# Patient Record
Sex: Female | Born: 1945 | Hispanic: Yes | Marital: Married | Smoking: Never smoker
Health system: Southern US, Community
[De-identification: ages and names within clinical notes are randomized; demographics above are authoritative.]

## PROBLEM LIST (undated history)

## (undated) DIAGNOSIS — I10 Essential (primary) hypertension: Secondary | ICD-10-CM

## (undated) DIAGNOSIS — Z972 Presence of dental prosthetic device (complete) (partial): Secondary | ICD-10-CM

## (undated) DIAGNOSIS — R52 Pain, unspecified: Secondary | ICD-10-CM

## (undated) DIAGNOSIS — F419 Anxiety disorder, unspecified: Secondary | ICD-10-CM

---

## 2004-11-29 ENCOUNTER — Ambulatory Visit: Payer: Self-pay | Admitting: Family Medicine

## 2005-01-23 ENCOUNTER — Ambulatory Visit: Payer: Self-pay

## 2006-02-05 ENCOUNTER — Ambulatory Visit: Payer: Self-pay

## 2007-09-10 ENCOUNTER — Ambulatory Visit: Payer: Self-pay

## 2009-03-14 ENCOUNTER — Ambulatory Visit: Payer: Self-pay

## 2010-07-26 ENCOUNTER — Ambulatory Visit: Payer: Self-pay | Admitting: Family Medicine

## 2011-12-30 ENCOUNTER — Ambulatory Visit: Payer: Self-pay | Admitting: Family Medicine

## 2012-10-04 ENCOUNTER — Emergency Department: Payer: Self-pay | Admitting: Internal Medicine

## 2012-10-04 LAB — URINALYSIS, COMPLETE
Bacteria: NONE SEEN
Bilirubin,UR: NEGATIVE
Glucose,UR: NEGATIVE mg/dL (ref 0–75)
Leukocyte Esterase: NEGATIVE
Ph: 7 (ref 4.5–8.0)
Specific Gravity: 1.017 (ref 1.003–1.030)
Squamous Epithelial: 1
WBC UR: 3 /HPF (ref 0–5)

## 2012-10-04 LAB — CBC
HGB: 14.3 g/dL (ref 12.0–16.0)
MCH: 27.8 pg (ref 26.0–34.0)
MCV: 84 fL (ref 80–100)
RBC: 5.16 10*6/uL (ref 3.80–5.20)
RDW: 13.8 % (ref 11.5–14.5)
WBC: 7.4 10*3/uL (ref 3.6–11.0)

## 2012-10-04 LAB — COMPREHENSIVE METABOLIC PANEL
Albumin: 3.3 g/dL — ABNORMAL LOW (ref 3.4–5.0)
Anion Gap: 9 (ref 7–16)
Bilirubin,Total: 0.7 mg/dL (ref 0.2–1.0)
Calcium, Total: 8.7 mg/dL (ref 8.5–10.1)
Creatinine: 0.59 mg/dL — ABNORMAL LOW (ref 0.60–1.30)
Osmolality: 277 (ref 275–301)
Potassium: 2.7 mmol/L — ABNORMAL LOW (ref 3.5–5.1)
SGOT(AST): 29 U/L (ref 15–37)
Sodium: 139 mmol/L (ref 136–145)
Total Protein: 7.8 g/dL (ref 6.4–8.2)

## 2012-10-04 LAB — LIPASE, BLOOD: Lipase: 187 U/L (ref 73–393)

## 2013-04-14 ENCOUNTER — Ambulatory Visit: Payer: Self-pay | Admitting: Family Medicine

## 2014-02-03 ENCOUNTER — Ambulatory Visit: Payer: Self-pay | Admitting: Family Medicine

## 2014-07-13 ENCOUNTER — Ambulatory Visit: Payer: Self-pay | Admitting: Nurse Practitioner

## 2015-03-28 ENCOUNTER — Encounter: Payer: Self-pay | Admitting: Emergency Medicine

## 2015-03-28 ENCOUNTER — Observation Stay
Admission: EM | Admit: 2015-03-28 | Discharge: 2015-03-31 | Disposition: A | Discharge status: Home / Self Care | Payer: Medicare Other | Attending: Surgery | Admitting: Surgery

## 2015-03-28 ENCOUNTER — Emergency Department: Payer: Medicare Other

## 2015-03-28 DIAGNOSIS — K8020 Calculus of gallbladder without cholecystitis without obstruction: Secondary | ICD-10-CM | POA: Diagnosis present

## 2015-03-28 DIAGNOSIS — R51 Headache: Secondary | ICD-10-CM | POA: Diagnosis not present

## 2015-03-28 DIAGNOSIS — R1011 Right upper quadrant pain: Secondary | ICD-10-CM | POA: Diagnosis present

## 2015-03-28 DIAGNOSIS — I10 Essential (primary) hypertension: Secondary | ICD-10-CM | POA: Diagnosis not present

## 2015-03-28 DIAGNOSIS — K8000 Calculus of gallbladder with acute cholecystitis without obstruction: Principal | ICD-10-CM | POA: Insufficient documentation

## 2015-03-28 DIAGNOSIS — K8010 Calculus of gallbladder with chronic cholecystitis without obstruction: Secondary | ICD-10-CM

## 2015-03-28 DIAGNOSIS — K810 Acute cholecystitis: Secondary | ICD-10-CM | POA: Diagnosis not present

## 2015-03-28 HISTORY — DX: Essential (primary) hypertension: I10

## 2015-03-28 LAB — COMPREHENSIVE METABOLIC PANEL
ALBUMIN: 4.2 g/dL (ref 3.5–5.0)
ALK PHOS: 78 U/L (ref 38–126)
ALT: 21 U/L (ref 14–54)
AST: 22 U/L (ref 15–41)
Anion gap: 5 (ref 5–15)
BUN: 12 mg/dL (ref 6–20)
CO2: 28 mmol/L (ref 22–32)
Calcium: 8.7 mg/dL — ABNORMAL LOW (ref 8.9–10.3)
Chloride: 104 mmol/L (ref 101–111)
Creatinine, Ser: 0.53 mg/dL (ref 0.44–1.00)
GFR calc Af Amer: >60 mL/min (ref >60)
GFR calc non Af Amer: >60 mL/min (ref >60)
GLUCOSE: 97 mg/dL (ref 65–99)
Potassium: 3 mmol/L — ABNORMAL LOW (ref 3.5–5.1)
SODIUM: 137 mmol/L (ref 135–145)
TOTAL PROTEIN: 7.4 g/dL (ref 6.5–8.1)
Total Bilirubin: 1 mg/dL (ref 0.3–1.2)

## 2015-03-28 LAB — CBC
HEMATOCRIT: 43.5 % (ref 35.0–47.0)
HEMOGLOBIN: 14.1 g/dL (ref 12.0–16.0)
MCH: 27.8 pg (ref 26.0–34.0)
MCHC: 32.4 g/dL (ref 32.0–36.0)
MCV: 85.6 fL (ref 80.0–100.0)
PLATELETS: 205 10*3/uL (ref 150–440)
RBC: 5.08 MIL/uL (ref 3.80–5.20)
RDW: 14.2 % (ref 11.5–14.5)
WBC: 5.7 10*3/uL (ref 3.6–11.0)

## 2015-03-28 LAB — URINALYSIS COMPLETE WITH MICROSCOPIC (ARMC ONLY)
Bilirubin Urine: NEGATIVE
Glucose, UA: NEGATIVE mg/dL
HGB URINE DIPSTICK: NEGATIVE
KETONES UR: NEGATIVE mg/dL
NITRITE: NEGATIVE
Protein, ur: NEGATIVE mg/dL
SPECIFIC GRAVITY, URINE: 1.01 (ref 1.005–1.030)
pH: 6 (ref 5.0–8.0)

## 2015-03-28 LAB — LIPASE, BLOOD: LIPASE: 25 U/L (ref 22–51)

## 2015-03-28 LAB — CBC WITH DIFFERENTIAL/PLATELET
Basophils Absolute: 0.1 10*3/uL (ref 0–0.1)
Basophils Relative: 1 %
Eosinophils Absolute: 0.2 10*3/uL (ref 0–0.7)
Eosinophils Relative: 4 %
HCT: 44 % (ref 35.0–47.0)
Hemoglobin: 14.6 g/dL (ref 12.0–16.0)
LYMPHS PCT: 31 %
Lymphs Abs: 1.8 10*3/uL (ref 1.0–3.6)
MCH: 28 pg (ref 26.0–34.0)
MCHC: 33.2 g/dL (ref 32.0–36.0)
MCV: 84.6 fL (ref 80.0–100.0)
Monocytes Absolute: 0.3 10*3/uL (ref 0.2–0.9)
Monocytes Relative: 5 %
Neutro Abs: 3.4 10*3/uL (ref 1.4–6.5)
Neutrophils Relative %: 59 %
PLATELETS: 212 10*3/uL (ref 150–440)
RBC: 5.2 MIL/uL (ref 3.80–5.20)
RDW: 14.6 % — AB (ref 11.5–14.5)
WBC: 5.7 10*3/uL (ref 3.6–11.0)

## 2015-03-28 LAB — TROPONIN I: Troponin I: <0.03 ng/mL (ref <0.031)

## 2015-03-28 LAB — CREATININE, SERUM
CREATININE: 0.59 mg/dL (ref 0.44–1.00)
GFR calc Af Amer: >60 mL/min (ref >60)

## 2015-03-28 MED ORDER — MORPHINE SULFATE 2 MG/ML IJ SOLN
2.0000 mg | INTRAMUSCULAR | Status: DC | PRN
  Administered 2015-03-28 – 2015-03-30 (×4): 4 mg via INTRAVENOUS
  Filled 2015-03-28 (×4): qty 2

## 2015-03-28 MED ORDER — ONDANSETRON HCL 4 MG/2ML IJ SOLN
INTRAMUSCULAR | Status: AC
  Administered 2015-03-28: 4 mg via INTRAVENOUS
  Filled 2015-03-28: qty 2

## 2015-03-28 MED ORDER — HEPARIN SODIUM (PORCINE) 5000 UNIT/ML IJ SOLN
5000.0000 [IU] | Freq: Three times a day (TID) | INTRAMUSCULAR | Status: DC
Start: 2015-03-28 — End: 2015-03-28

## 2015-03-28 MED ORDER — ONDANSETRON HCL 4 MG/2ML IJ SOLN
4.0000 mg | Freq: Once | INTRAMUSCULAR | Status: AC
  Administered 2015-03-28: 4 mg via INTRAVENOUS

## 2015-03-28 MED ORDER — CEFAZOLIN SODIUM-DEXTROSE 2-3 GM-% IV SOLR
2.0000 g | Freq: Once | INTRAVENOUS | Status: AC
  Administered 2015-03-29: 2 g via INTRAVENOUS
  Filled 2015-03-28 (×2): qty 50

## 2015-03-28 MED ORDER — KETOROLAC TROMETHAMINE 30 MG/ML IJ SOLN
INTRAMUSCULAR | Status: AC
  Administered 2015-03-28: 30 mg via INTRAVENOUS
  Filled 2015-03-28: qty 1

## 2015-03-28 MED ORDER — ONDANSETRON HCL 4 MG/2ML IJ SOLN
4.0000 mg | Freq: Four times a day (QID) | INTRAMUSCULAR | Status: DC | PRN
  Administered 2015-03-29 – 2015-03-30 (×5): 4 mg via INTRAVENOUS
  Filled 2015-03-28 (×4): qty 2

## 2015-03-28 MED ORDER — PANTOPRAZOLE SODIUM 40 MG IV SOLR
40.0000 mg | Freq: Every day | INTRAVENOUS | Status: DC
  Administered 2015-03-28 – 2015-03-30 (×2): 40 mg via INTRAVENOUS
  Filled 2015-03-28 (×2): qty 40

## 2015-03-28 MED ORDER — HEPARIN SODIUM (PORCINE) 5000 UNIT/ML IJ SOLN
5000.0000 [IU] | Freq: Three times a day (TID) | INTRAMUSCULAR | Status: DC
  Administered 2015-03-28 – 2015-03-31 (×7): 5000 [IU] via SUBCUTANEOUS
  Filled 2015-03-28 (×8): qty 1

## 2015-03-28 MED ORDER — KETOROLAC TROMETHAMINE 30 MG/ML IJ SOLN
30.0000 mg | Freq: Once | INTRAMUSCULAR | Status: AC
  Administered 2015-03-28: 30 mg via INTRAVENOUS

## 2015-03-28 MED ORDER — POTASSIUM CHLORIDE IN NACL 20-0.9 MEQ/L-% IV SOLN
INTRAVENOUS | Status: DC
  Administered 2015-03-28 – 2015-03-30 (×4): via INTRAVENOUS
  Filled 2015-03-28 (×8): qty 1000

## 2015-03-28 MED ORDER — POTASSIUM CHLORIDE IN NACL 20-0.9 MEQ/L-% IV SOLN: INTRAVENOUS | Status: DC

## 2015-03-28 MED ORDER — SODIUM CHLORIDE 0.9 % IV BOLUS (SEPSIS)
1000.0000 mL | Freq: Once | INTRAVENOUS | Status: AC
  Administered 2015-03-28: 1000 mL via INTRAVENOUS

## 2015-03-28 NOTE — ED Notes (Signed)
Patient to ultrasound via stretcher by RN

## 2015-03-28 NOTE — ED Notes (Signed)
IV started. Medication given. Patient resting comfortably. Family at bedside. Patient voices no concerns at this time.

## 2015-03-28 NOTE — H&P (Signed)
CC: RUQ pain, postprandial x 3 days  HPI: [name] is a pleasant 69 yo F who presents with 3 days of RUQ pain.  Began acutely.  Worse after eating and with taking ibuprofen.  No nausea/vomiting.  No fevers/chills.  No sick contacts, no unusual ingestions. Has never had before.  No chest pain, shortness of breath, cough, dysuria/hematuria.    Active Ambulatory Problems    Diagnosis Date Noted  . No Active Ambulatory Problems   Resolved Ambulatory Problems    Diagnosis Date Noted  . No Resolved Ambulatory Problems   Past Medical History  Diagnosis Date  . Hypertension    No Known Allergies   . History   Social History  . Marital Status: Married    Spouse Name: N/A  . Number of Children: N/A  . Years of Education: N/A   Occupational History  . Not on file.   Social History Main Topics  . Smoking status: Never Smoker   . Smokeless tobacco: Not on file  . Alcohol Use: No  . Drug Use: No  . Sexual Activity: Not on file   Other Topics Concern  . Not on file   Social History Narrative  . No narrative on file   ROS: Full ros obtained, pertinent positives and negatives per HPI.    Blood pressure 174/83, pulse 61, temperature 98 F (36.7 C), resp. rate 16, height 5' (1.524 m), weight 80.287 kg (177 lb), SpO2 98 %. GEN: NAD/A&Ox3 FACE: no obvious facial trauma, normal external nose, normal external ears EYES: no scleral icterus, no conjunctivitis HEAD: normocephalic atraumatic CV: RRR, no MRG RESP: moving air well, lungs clear ABD: soft, mild tender RUQ, nondistended EXT: moving all ext well, strength 5/5 NEURO: cnII-XII grossly intact, sensation intact all 4 ext  Labs: personally reviewed, pertinent findings include WBC 5.7, Bili 1.1  U/S Gallbladder - multiple gallstones, cbd to 8.5 mm  A/P 69 yo F with 3 days RUQ pain, gallstones.  Pain characteristic of symptomatic cholelithiasis.  Have recommended cholecystectomy.  Have discussed benefits and potential  complications associated with procedure including 1:200 risk of CBD injury.  CBD mildly dilated.  Will f/u LFT in am to ensure no choledocholithiasis.

## 2015-03-28 NOTE — ED Notes (Signed)
Patient has had right upper quadrant pain for last 5 days. States it is worse after eating. Went to walk-in clinic today and was told to come here for testing.

## 2015-03-28 NOTE — ED Notes (Signed)
Pt to ed with c/o right upper abd pain since Friday.  Pt denies n/v/d.  States pain is worse after eating.

## 2015-03-28 NOTE — ED Provider Notes (Signed)
[facility] Emergency Department Provider Note  ____________________________________________  Time seen: 10:45 AM  I have reviewed the triage vital signs and the nursing notes.   HISTORY  Chief Complaint Abdominal Pain    HPI Anne Castillo is a 69 y.o. female who complains of right upper quadrant abdominal pain for 4 days. She denies fevers or chills, but notes that she has decreased appetite and the pain gets worse after she eats. No nausea vomiting or diarrhea. Never had pain like this before, no surgical history. No chest pain or shortness of breath or cough runny nose or recent illness.     Past Medical History  Diagnosis Date  . Hypertension     There are no active problems to display for this patient.   History reviewed. No pertinent past surgical history.  Current Outpatient Rx  Name  Route  Sig  Dispense  Refill  . hydrochlorothiazide (HYDRODIURIL) 25 MG tablet   Oral   Take 25 mg by mouth daily.           Allergies Review of patient's allergies indicates no known allergies.  History reviewed. No pertinent family history.  Social History History  Substance Use Topics  . Smoking status: Never Smoker   . Smokeless tobacco: Not on file  . Alcohol Use: No    Review of Systems  Constitutional: No fever or chills. No weight changes Eyes:No blurry vision or double vision.  ENT: No sore throat. Cardiovascular: No chest pain. Respiratory: No dyspnea or cough. Gastrointestinal: As above. No BRBPR or melena. Genitourinary: Negative for dysuria, urinary retention, bloody urine, or difficulty urinating. Musculoskeletal: Negative for back pain. No joint swelling or pain. Skin: Negative for rash. Neurological: Negative for headaches, focal weakness or numbness. Psychiatric:No anxiety or depression.   Endocrine:No hot/cold intolerance, changes in energy, or sleep difficulty.  10-point ROS otherwise  negative.  ____________________________________________   PHYSICAL EXAM:  VITAL SIGNS: ED Triage Vitals  Enc Vitals Group     BP 03/28/15 1014 207/92 mmHg     Pulse Rate 03/28/15 1014 78     Resp 03/28/15 1014 20     Temp 03/28/15 1014 98 F (36.7 C)     Temp src --      SpO2 03/28/15 1014 100 %     Weight 03/28/15 1014 177 lb (80.287 kg)     Height 03/28/15 1014 5' (1.524 m)     Head Cir --      Peak Flow --      Pain Score 03/28/15 1014 7     Pain Loc --      Pain Edu? --      Excl. in [place]? --      Constitutional: Alert and oriented. Mild distress due to pain Eyes: No scleral icterus. No conjunctival pallor. PERRL. EOMI ENT   Head: Normocephalic and atraumatic.   Nose: No congestion/rhinnorhea. No septal hematoma   Mouth/Throat: MMM, no pharyngeal erythema. No peritonsillar mass. No uvula shift.   Neck: No stridor. No SubQ emphysema. No meningismus. Hematological/Lymphatic/Immunilogical: No cervical lymphadenopathy. Cardiovascular: RRR. Normal and symmetric distal pulses are present in all extremities. No murmurs, rubs, or gallops. Respiratory: Normal respiratory effort without tachypnea nor retractions. Breath sounds are clear and equal bilaterally. No wheezes/rales/rhonchi. Gastrointestinal: Soft with tenderness in the right upper quadrant anteriorly and laterally, particularly with deep palpation. No distention. There is no CVA tenderness.  No rebound, rigidity, or guarding. Genitourinary: deferred Musculoskeletal: Nontender with normal range of motion  in all extremities. No joint effusions.  No lower extremity tenderness.  No edema. Neurologic:   Normal speech and language.  CN 2-10 normal. Motor grossly intact. No pronator drift.  Normal gait. No gross focal neurologic deficits are appreciated.  Skin:  Skin is warm, dry and intact. No rash noted.  No petechiae, purpura, or bullae. Psychiatric: Mood and affect are normal. Speech and behavior are  normal. Patient exhibits appropriate insight and judgment.  ____________________________________________    LABS (pertinent positives/negatives) (all labs ordered are listed, but only abnormal results are displayed) Labs Reviewed  CBC WITH DIFFERENTIAL/PLATELET - Abnormal; Notable for the following:    RDW 14.6 (*)    All other components within normal limits  COMPREHENSIVE METABOLIC PANEL - Abnormal; Notable for the following:    Potassium 3.0 (*)    Calcium 8.7 (*)    All other components within normal limits  URINALYSIS COMPLETEWITH MICROSCOPIC ([facility] ONLY) - Abnormal; Notable for the following:    Color, Urine STRAW (*)    APPearance CLEAR (*)    Leukocytes, UA TRACE (*)    Bacteria, UA RARE (*)    Squamous Epithelial / LPF 0-5 (*)    All other components within normal limits  LIPASE, BLOOD  TROPONIN I   ____________________________________________   EKG  Interpreted by me  Date: 03/28/2015  Rate: 69  Rhythm: normal sinus rhythm  QRS Axis: normal  Intervals: normal  ST/T Wave abnormalities: normal  Conduction Disutrbances: none  Narrative Interpretation: unremarkable      ____________________________________________    RADIOLOGY  Ultrasound right upper quadrant reveals multiple gallstones with gallbladder wall thickening. There is a dilated CBD without a clearly identified obstructing lesion.  ____________________________________________   PROCEDURES  ____________________________________________   INITIAL IMPRESSION / ASSESSMENT AND PLAN / ED COURSE  Pertinent labs & imaging results that were available during my care of the patient were reviewed by me and considered in my medical decision making (see chart for details).  Patient presents with symptoms concerning for cholecystitis. We will check labs and ultrasound. IV fluids Toradol Zofran.  ----------------------------------------- 3:25 PM on  03/28/2015 -----------------------------------------  Ultrasound concerning for early cholecystitis. Patient reports continued pain in the area, and has persistent tenderness in the right upper quadrant. Vital signs are unremarkable as are labs at this time. I discussed the case with surgery Dr. Rexene Edison who will evaluate the patient in the ED for further management.  ____________________________________________   FINAL CLINICAL IMPRESSION(S) / ED DIAGNOSES  Final diagnoses:  Cholelithiasis with cholecystitis      Carrie Mew, MD 03/28/15 1526

## 2015-03-28 NOTE — ED Notes (Signed)
Brought over from [facility] with RUQ pain for about 5 days ..rates pain 8/10

## 2015-03-29 ENCOUNTER — Encounter
Admission: EM | Disposition: A | Discharge status: Home / Self Care | Payer: Self-pay | Source: Home / Self Care | Attending: Emergency Medicine

## 2015-03-29 ENCOUNTER — Observation Stay: Payer: Medicare Other | Admitting: Certified Registered Nurse Anesthetist

## 2015-03-29 DIAGNOSIS — K810 Acute cholecystitis: Secondary | ICD-10-CM | POA: Diagnosis not present

## 2015-03-29 HISTORY — PX: CHOLECYSTECTOMY: SHX55

## 2015-03-29 LAB — COMPREHENSIVE METABOLIC PANEL
ALBUMIN: 3.3 g/dL — AB (ref 3.5–5.0)
ALT: 18 U/L (ref 14–54)
ANION GAP: 7 (ref 5–15)
AST: 19 U/L (ref 15–41)
Alkaline Phosphatase: 60 U/L (ref 38–126)
BUN: 19 mg/dL (ref 6–20)
CO2: 27 mmol/L (ref 22–32)
Calcium: 8.2 mg/dL — ABNORMAL LOW (ref 8.9–10.3)
Chloride: 108 mmol/L (ref 101–111)
Creatinine, Ser: 0.68 mg/dL (ref 0.44–1.00)
GFR calc Af Amer: >60 mL/min (ref >60)
GFR calc non Af Amer: >60 mL/min (ref >60)
GLUCOSE: 125 mg/dL — AB (ref 65–99)
POTASSIUM: 3.4 mmol/L — AB (ref 3.5–5.1)
Sodium: 142 mmol/L (ref 135–145)
TOTAL PROTEIN: 6.3 g/dL — AB (ref 6.5–8.1)
Total Bilirubin: 0.7 mg/dL (ref 0.3–1.2)

## 2015-03-29 LAB — CBC
HCT: 41.5 % (ref 35.0–47.0)
Hemoglobin: 13.8 g/dL (ref 12.0–16.0)
MCH: 28.5 pg (ref 26.0–34.0)
MCHC: 33.2 g/dL (ref 32.0–36.0)
MCV: 85.8 fL (ref 80.0–100.0)
PLATELETS: 194 10*3/uL (ref 150–440)
RBC: 4.84 MIL/uL (ref 3.80–5.20)
RDW: 14.2 % (ref 11.5–14.5)
WBC: 5.3 10*3/uL (ref 3.6–11.0)

## 2015-03-29 SURGERY — LAPAROSCOPIC CHOLECYSTECTOMY
Anesthesia: General

## 2015-03-29 MED ORDER — HYDROMORPHONE HCL 1 MG/ML IJ SOLN: 0.2500 mg | INTRAMUSCULAR | Status: DC | PRN

## 2015-03-29 MED ORDER — FENTANYL CITRATE (PF) 100 MCG/2ML IJ SOLN
INTRAMUSCULAR | Status: DC | PRN
  Administered 2015-03-29 (×3): 50 ug via INTRAVENOUS

## 2015-03-29 MED ORDER — LACTATED RINGERS IV SOLN
INTRAVENOUS | Status: DC | PRN
  Administered 2015-03-29: 20:00:00 via INTRAVENOUS

## 2015-03-29 MED ORDER — MIDAZOLAM HCL 2 MG/2ML IJ SOLN
INTRAMUSCULAR | Status: DC | PRN
  Administered 2015-03-29: 2 mg via INTRAVENOUS

## 2015-03-29 MED ORDER — PROPOFOL 10 MG/ML IV BOLUS
INTRAVENOUS | Status: DC | PRN
  Administered 2015-03-29: 150 mg via INTRAVENOUS

## 2015-03-29 MED ORDER — LIDOCAINE HCL 1 % IJ SOLN
INTRAMUSCULAR | Status: DC | PRN
  Administered 2015-03-29: 20 mL via SUBCUTANEOUS

## 2015-03-29 MED ORDER — HYDROCHLOROTHIAZIDE 25 MG PO TABS
25.0000 mg | ORAL_TABLET | Freq: Every day | ORAL | Status: DC
  Administered 2015-03-30 – 2015-03-31 (×2): 25 mg via ORAL
  Filled 2015-03-29 (×2): qty 1

## 2015-03-29 MED ORDER — HYDROCODONE-ACETAMINOPHEN 5-325 MG PO TABS
1.0000 | ORAL_TABLET | ORAL | Status: DC | PRN
  Administered 2015-03-30: 2 via ORAL
  Filled 2015-03-29: qty 2

## 2015-03-29 MED ORDER — ONDANSETRON HCL 4 MG/2ML IJ SOLN: 4.0000 mg | Freq: Once | INTRAMUSCULAR | Status: DC | PRN

## 2015-03-29 MED ORDER — FENTANYL CITRATE (PF) 100 MCG/2ML IJ SOLN
25.0000 ug | INTRAMUSCULAR | Status: AC | PRN
  Administered 2015-03-29 (×6): 25 ug via INTRAVENOUS

## 2015-03-29 MED ORDER — GLYCOPYRROLATE 0.2 MG/ML IJ SOLN
INTRAMUSCULAR | Status: DC | PRN
  Administered 2015-03-29: 0.6 mg via INTRAVENOUS

## 2015-03-29 MED ORDER — LABETALOL HCL 5 MG/ML IV SOLN
INTRAVENOUS | Status: DC | PRN
  Administered 2015-03-29: 5 mg via INTRAVENOUS

## 2015-03-29 MED ORDER — ROCURONIUM BROMIDE 100 MG/10ML IV SOLN
INTRAVENOUS | Status: DC | PRN
  Administered 2015-03-29: 30 mg via INTRAVENOUS

## 2015-03-29 MED ORDER — CEFAZOLIN SODIUM 1-5 GM-% IV SOLN
INTRAVENOUS | Status: DC | PRN
  Administered 2015-03-29: 1 g via INTRAVENOUS

## 2015-03-29 MED ORDER — NEOSTIGMINE METHYLSULFATE 10 MG/10ML IV SOLN
INTRAVENOUS | Status: DC | PRN
  Administered 2015-03-29: 3 mg via INTRAVENOUS

## 2015-03-29 MED ORDER — ACETAMINOPHEN 325 MG PO TABS
650.0000 mg | ORAL_TABLET | Freq: Four times a day (QID) | ORAL | Status: DC | PRN
  Administered 2015-03-29 – 2015-03-31 (×4): 650 mg via ORAL
  Filled 2015-03-29 (×4): qty 2

## 2015-03-29 SURGICAL SUPPLY — 50 items
APPLIER CLIP ROT 10 11.4 M/L (STAPLE) ×3
BAG COUNTER SPONGE EZ (MISCELLANEOUS) IMPLANT
BLADE SURG SZ11 CARB STEEL (BLADE) ×3 IMPLANT
BULB RESERV EVAC DRAIN JP 100C (MISCELLANEOUS) IMPLANT
CANISTER SUCT 1200ML W/VALVE (MISCELLANEOUS) ×3 IMPLANT
CATH REDDICK CHOLANGI 4FR 50CM (CATHETERS) IMPLANT
CHLORAPREP W/TINT 26ML (MISCELLANEOUS) ×3 IMPLANT
CLIP APPLIE ROT 10 11.4 M/L (STAPLE) ×1 IMPLANT
CLOSURE WOUND 1/2 X4 (GAUZE/BANDAGES/DRESSINGS) ×1
CONRAY 60ML FOR OR (MISCELLANEOUS) IMPLANT
COUNTER SPONGE BAG EZ (MISCELLANEOUS)
CUTTER LINEAR ENDO 35 ART THIN (STAPLE) ×3 IMPLANT
DISSECTOR KITTNER STICK (MISCELLANEOUS) ×1 IMPLANT
DISSECTORS/KITTNER STICK (MISCELLANEOUS) ×3
DRAIN CHANNEL JP 19F (MISCELLANEOUS) IMPLANT
DRAPE SHEET LG 3/4 BI-LAMINATE (DRAPES) ×3 IMPLANT
DRSG TEGADERM 2-3/8X2-3/4 SM (GAUZE/BANDAGES/DRESSINGS) ×12 IMPLANT
DRSG TELFA 3X8 NADH (GAUZE/BANDAGES/DRESSINGS) ×3 IMPLANT
ENDOLOOP SUT PDS II  0 18 (SUTURE)
ENDOLOOP SUT PDS II 0 18 (SUTURE) IMPLANT
ENDOPOUCH RETRIEVER 10 (MISCELLANEOUS) ×3 IMPLANT
GLOVE BIO SURGEON STRL SZ7.5 (GLOVE) ×3 IMPLANT
GOWN STRL REUS W/ TWL LRG LVL3 (GOWN DISPOSABLE) ×3 IMPLANT
GOWN STRL REUS W/TWL LRG LVL3 (GOWN DISPOSABLE) ×6
IRRIGATION STRYKERFLOW (MISCELLANEOUS) ×1 IMPLANT
IRRIGATOR STRYKERFLOW (MISCELLANEOUS) ×3
IV CATH ANGIO 12GX3 LT BLUE (NEEDLE) IMPLANT
IV NS 1000ML (IV SOLUTION) ×2
IV NS 1000ML BAXH (IV SOLUTION) ×1 IMPLANT
LABEL OR SOLS (LABEL) ×3 IMPLANT
LIQUID BAND (GAUZE/BANDAGES/DRESSINGS) IMPLANT
NDL SAFETY 25GX1.5 (NEEDLE) ×3 IMPLANT
NS IRRIG 500ML POUR BTL (IV SOLUTION) ×3 IMPLANT
PACK LAP CHOLECYSTECTOMY (MISCELLANEOUS) ×3 IMPLANT
PAD GROUND ADULT SPLIT (MISCELLANEOUS) ×3 IMPLANT
SCISSORS METZENBAUM CVD 33 (INSTRUMENTS) ×3 IMPLANT
SEAL FOR SCOPE WARMER C3101 (MISCELLANEOUS) IMPLANT
SLEEVE ENDOPATH XCEL 5M (ENDOMECHANICALS) ×3 IMPLANT
STRAP SAFETY BODY (MISCELLANEOUS) ×3 IMPLANT
STRIP CLOSURE SKIN 1/2X4 (GAUZE/BANDAGES/DRESSINGS) ×2 IMPLANT
SUT MNCRL 4-0 (SUTURE) ×2
SUT MNCRL 4-0 27XMFL (SUTURE) ×1
SUT VICRYL 0 AB UR-6 (SUTURE) ×6 IMPLANT
SUTURE MNCRL 4-0 27XMF (SUTURE) ×1 IMPLANT
SWABSTK COMLB BENZOIN TINCTURE (MISCELLANEOUS) IMPLANT
TROCAR XCEL BLUNT TIP 100MML (ENDOMECHANICALS) ×3 IMPLANT
TROCAR XCEL NON-BLD 11X100MML (ENDOMECHANICALS) ×3 IMPLANT
TROCAR XCEL NON-BLD 5MMX100MML (ENDOMECHANICALS) ×3 IMPLANT
TUBING INSUFFLATOR HI FLOW (MISCELLANEOUS) ×3 IMPLANT
WATER STERILE IRR 1000ML POUR (IV SOLUTION) ×3 IMPLANT

## 2015-03-29 NOTE — Anesthesia Preprocedure Evaluation (Signed)
Anesthesia Evaluation  Patient identified by MRN, date of birth, ID band Patient awake    Reviewed: Allergy & Precautions, NPO status , Patient's Chart, lab work & pertinent test results  History of Anesthesia Complications Negative for: history of anesthetic complications  Airway Mallampati: II  TM Distance: >3 FB Neck ROM: Full    Dental  (+) Partial Upper   Pulmonary neg pulmonary ROS,  breath sounds clear to auscultation  Pulmonary exam normal       Cardiovascular Exercise Tolerance: Good hypertension, Pt. on medications Normal cardiovascular examRhythm:Regular Rate:Normal     Neuro/Psych negative neurological ROS  negative psych ROS   GI/Hepatic negative GI ROS, Neg liver ROS,   Endo/Other  negative endocrine ROS  Renal/GU negative Renal ROS  negative genitourinary   Musculoskeletal negative musculoskeletal ROS (+)   Abdominal   Peds negative pediatric ROS (+)  Hematology negative hematology ROS (+)   Anesthesia Other Findings   Reproductive/Obstetrics negative OB ROS                             Anesthesia Physical Anesthesia Plan  ASA: II  Anesthesia Plan: General   Post-op Pain Management:    Induction: Intravenous  Airway Management Planned: Oral ETT  Additional Equipment:   Intra-op Plan:   Post-operative Plan: Extubation in OR  Informed Consent: I have reviewed the patients History and Physical, chart, labs and discussed the procedure including the risks, benefits and alternatives for the proposed anesthesia with the patient or authorized representative who has indicated his/her understanding and acceptance.   Dental advisory given  Plan Discussed with: CRNA and Surgeon  Anesthesia Plan Comments:         Anesthesia Quick Evaluation

## 2015-03-29 NOTE — Anesthesia Procedure Notes (Signed)
Procedure Name: Intubation Date/Time: 03/29/2015 8:00 PM Performed by: Lendon Colonel Pre-anesthesia Checklist: Emergency Drugs available, Patient identified, Suction available, Patient being monitored and Timeout performed Patient Re-evaluated:Patient Re-evaluated prior to inductionOxygen Delivery Method: Circle system utilized Preoxygenation: Pre-oxygenation with 100% oxygen Intubation Type: IV induction Ventilation: Mask ventilation without difficulty Laryngoscope Size: Miller and 2 Grade View: Grade II Tube type: Oral Number of attempts: 1 Airway Equipment and Method: Stylet Placement Confirmation: ETT inserted through vocal cords under direct vision,  positive ETCO2 and breath sounds checked- equal and bilateral Secured at: 20 cm Tube secured with: Tape Dental Injury: Teeth and Oropharynx as per pre-operative assessment  Future Recommendations: Recommend- awake intubation

## 2015-03-29 NOTE — Progress Notes (Signed)
Called Dr. Bary Castilla @ [phone] to get an order for tylenol per patient request.  Doctor consented.  Christene Slates  03/29/2015 4:55 AM

## 2015-03-29 NOTE — Op Note (Signed)
Preop dx: Symptomatic cholelithiasis Postop dx: Acute cholecystitis Procedure performed: Laparoscopic cholecystectomy Anesthesia: General EBL: 25 ml Complications: None Specimen: gallbladder  Indication for surgery: Ms Zanetti is a pleasant 69 yo F who presents with recurrent RUQ pain and gallstones. Her pain thought to be biliary in nature so I offered cholecystectomy.  Details of surgery: Informed consent was obtained.  Ms. Letterman was brought to the OR suite and laid supine on the OR table.  She was induced, ETT was placed, general anesthesia was administered.  Her abdomen was prepped and draped.  A timeout was performed correctly identifying patient name, operative site and procedure to be performed.  A supraumbilical incision was made and deepened to the fascia.  The fascia was incised, peritoneum was entered.  Two stay sutures were placed through the fasciotomy.  Hassan trocar was placed and abdomen was insufflated.  An 26mm epigastric and 2 5 mm subcostal trocars were placed.  Gallbladder was moderately inflamed.  Cystic duct and cystic artery were dissected out and critical view was obtained.  Cystic artery was clipped and ligated, cystic duct was mildly enlarged and transected with an endostapler.  Gallbladder was then taken off fossa and removed through umbilicus. Fossa was made hemostatic and irrigated until hemostasis obtained.  Trocars were then removed and abdomen desufflated.  Supraumbilical fascia was then closed with previously placed stay sutures.  Skin was then closed with interrupted deep dermal 4-0 monocryl.  Suture strips, telfa and tegaderm were used to dress incision.  Patient was then awoken, extubated and brought to PACU.  There were no immediate complications. Needle, sponge and instrument count was correct at the end of the procedure.

## 2015-03-29 NOTE — Progress Notes (Signed)
Surgery Progress Note  S: Min pain.   O: AF/VSS, good uop GEN: NAD/A&ox3 ABD: soft, min tender, nondistended  Labs Bili 0.7  A/P 69 yo f with symptomatic cholelithiasis, doing well - NPO - plan for lap chole today

## 2015-03-29 NOTE — Brief Op Note (Signed)
03/28/2015 - 03/29/2015  8:59 PM  PATIENT:  Tyler Pita  69 y.o. female  PRE-OPERATIVE DIAGNOSIS:  cholecystitis  POST-OPERATIVE DIAGNOSIS:  cholecystitis  PROCEDURE:  Procedure(s): LAPAROSCOPIC CHOLECYSTECTOMY (N/A)  SURGEON:  Surgeon(s) and Role:    * Marlyce Huge, MD - Primary  PHYSICIAN ASSISTANT:   ASSISTANTS: none   ANESTHESIA:   general  EBL:   57ml  BLOOD ADMINISTERED:none  DRAINS: none   LOCAL MEDICATIONS USED:  LIDOCAINE   SPECIMEN:  Excision  DISPOSITION OF SPECIMEN:  PATHOLOGY  COUNTS:  YES  TOURNIQUET:  * No tourniquets in log *  DICTATION: .Note written in EPIC  PLAN OF CARE: Admit for overnight observation  PATIENT DISPOSITION:  PACU - hemodynamically stable.   Delay start of Pharmacological VTE agent (>24hrs) due to surgical blood loss or risk of bleeding: no

## 2015-03-29 NOTE — Transfer of Care (Signed)
Immediate Anesthesia Transfer of Care Note  Patient: Anne Castillo  Procedure(s) Performed: Procedure(s): LAPAROSCOPIC CHOLECYSTECTOMY (N/A)  Patient Location: PACU  Anesthesia Type:General  Level of Consciousness: sedated  Airway & Oxygen Therapy: Patient Spontanous Breathing and Patient connected to face mask oxygen  Post-op Assessment: Report given to RN and Post -op Vital signs reviewed and stable  Post vital signs: Reviewed and stable  Last Vitals:  Filed Vitals:   03/29/15 1527  BP: 153/56  Pulse: 65  Temp: 36.5 C  Resp: 16    Complications: No apparent anesthesia complications

## 2015-03-30 ENCOUNTER — Encounter: Payer: Self-pay | Admitting: Surgery

## 2015-03-30 MED ORDER — PROMETHAZINE HCL 25 MG/ML IJ SOLN
12.5000 mg | Freq: Four times a day (QID) | INTRAMUSCULAR | Status: DC | PRN
  Administered 2015-03-30 (×2): 12.5 mg via INTRAVENOUS
  Filled 2015-03-30 (×2): qty 1

## 2015-03-30 NOTE — Progress Notes (Signed)
Surgery Progress note  S: Sore O: AF/VSS, good uop GEN: NAD/A&Ox3 ABD: soft, mild tender, nondistended  A/P 69 yo s/p lap chole, doing well - liquids advance - IV pain meds until tolerating PO

## 2015-03-30 NOTE — Progress Notes (Signed)
Spoke with Dr. Rexene Edison via phone regarding patient's nausea; orders received.

## 2015-03-30 NOTE — Anesthesia Postprocedure Evaluation (Addendum)
  Anesthesia Post-op Note  Patient: Anne Castillo  Procedure(s) Performed: Procedure(s): LAPAROSCOPIC CHOLECYSTECTOMY (N/A)  Anesthesia type:General  Patient location: PACU  Post pain: Pain level controlled  Post assessment: Post-op Vital signs reviewed, Patient's Cardiovascular Status Stable, Respiratory Function Stable, Patent Airway and No signs of Nausea or vomiting  Post vital signs: Reviewed and stable  Last Vitals:  Filed Vitals:   03/30/15 0527  BP: 151/71  Pulse: 93  Temp: 37.1 C  Resp: 18    Level of consciousness: awake, alert  and patient cooperative  Complications: No apparent anesthesia complications

## 2015-03-31 MED ORDER — BUTALBITAL-APAP-CAFFEINE 50-325-40 MG PO TABS
1.0000 | ORAL_TABLET | ORAL | Status: DC | PRN
  Administered 2015-03-31: 1 via ORAL
  Filled 2015-03-31: qty 1

## 2015-03-31 MED ORDER — HYDROCODONE-ACETAMINOPHEN 5-325 MG PO TABS: 1.0000 | ORAL_TABLET | ORAL | Status: DC | PRN

## 2015-03-31 NOTE — Discharge Instructions (Signed)
Do not drive on pain medications Do not lift greater than 15 lbs for a period of 6 weeks Call or return to ER if you develop fever greater than 101.5, nausea/vomiting, increased pain, redness/drainage from incisions Take bandages off in 48 hours.  Okay to shower with bandages on or after they come off, no tub baths

## 2015-03-31 NOTE — Progress Notes (Signed)
Pt d/c home; d/c instructions reviewed w/ pt; pt understanding was verbalized; IV removed catheter in tact, gauze dressing applied; all pt questions answered; pt left unit via wheelchair accompanied by staff 

## 2015-03-31 NOTE — Care Management (Signed)
Met with patient who had hoped to return home today but patient is still not able to tolerate diet. She denies RNCM needs.

## 2015-03-31 NOTE — Progress Notes (Signed)
Surgery Progress Note  S:  Headache, abdomen feeling better O: AF/VSS, good uop GEN: NAD/A&Ox3 ABD: soft, min tender, nondistended  A/P 69 yo F s/p lap chole, doing well - fiorcet - regular diet - PO pain meds - possible home later

## 2015-04-02 NOTE — Discharge Summary (Signed)
Discharge Diagnosis: Acute cholecystitis     Medication List    TAKE these medications        hydrochlorothiazide 25 MG tablet  Commonly known as:  HYDRODIURIL  Take 25 mg by mouth daily.     HYDROcodone-acetaminophen 5-325 MG per tablet  Commonly known as:  NORCO/VICODIN  Take 1-2 tablets by mouth every 4 (four) hours as needed for moderate pain.       Indication for admission: Anne Castillo is a pleasant 69 yo F who presents with RUQ pain, gallstones and pain which was characteristic of biliary pain.  She was admitted for surgical management of this pain.    Hospital Course: Anne Castillo was admitted and underwent unremarkable cholecystectomy.  Following surgery, her diet was advanced from clear liquid to regular.  Her pain medication was advanced as tolerated from IV to oral narcotics.  At time of discharge, Anne Castillo was discharged to home in satisfactory condition and tolerating a regular diet with pain control with PO pain medication

## 2015-04-03 LAB — SURGICAL PATHOLOGY

## 2015-04-06 ENCOUNTER — Encounter: Payer: Self-pay | Admitting: Surgery

## 2015-04-06 ENCOUNTER — Ambulatory Visit (INDEPENDENT_AMBULATORY_CARE_PROVIDER_SITE_OTHER): Payer: Medicare Other | Admitting: Surgery

## 2015-04-06 VITALS — BP 179/90 | HR 77 | Temp 98.3°F | Ht 61.0 in | Wt 173.0 lb

## 2015-04-06 DIAGNOSIS — Z09 Encounter for follow-up examination after completed treatment for conditions other than malignant neoplasm: Secondary | ICD-10-CM

## 2015-04-06 NOTE — Patient Instructions (Addendum)
Follow-up as needed.  Please call our office with questions and concerns.  Avoid heavy lifting- greater than 15 lbs if possible.

## 2015-04-06 NOTE — Progress Notes (Signed)
Surgery Clinic Note  S: Doing well.  No pain, tolerating diet.  Having BM O: AF/VSS, good uop GEN: NAD/A&Ox3 ABD: soft, min tender, incision c/d/i with some bruising  A/P 69 yo s/p lap chole, doing well - f/u prn - advised no heavy lifting x 5 more weeks

## 2015-11-27 ENCOUNTER — Emergency Department: Payer: Medicare Other

## 2015-11-27 ENCOUNTER — Emergency Department
Admission: EM | Admit: 2015-11-27 | Discharge: 2015-11-27 | Disposition: A | Discharge status: Home / Self Care | Payer: Medicare Other | Attending: Emergency Medicine | Admitting: Emergency Medicine

## 2015-11-27 ENCOUNTER — Encounter: Payer: Self-pay | Admitting: Emergency Medicine

## 2015-11-27 DIAGNOSIS — R079 Chest pain, unspecified: Secondary | ICD-10-CM | POA: Diagnosis not present

## 2015-11-27 DIAGNOSIS — I10 Essential (primary) hypertension: Secondary | ICD-10-CM | POA: Insufficient documentation

## 2015-11-27 DIAGNOSIS — Z79899 Other long term (current) drug therapy: Secondary | ICD-10-CM | POA: Diagnosis not present

## 2015-11-27 LAB — CBC
HEMATOCRIT: 44.1 % (ref 35.0–47.0)
Hemoglobin: 14.7 g/dL (ref 12.0–16.0)
MCH: 27.8 pg (ref 26.0–34.0)
MCHC: 33.2 g/dL (ref 32.0–36.0)
MCV: 83.7 fL (ref 80.0–100.0)
Platelets: 225 10*3/uL (ref 150–440)
RBC: 5.27 MIL/uL — ABNORMAL HIGH (ref 3.80–5.20)
RDW: 13.8 % (ref 11.5–14.5)
WBC: 6.5 10*3/uL (ref 3.6–11.0)

## 2015-11-27 LAB — BASIC METABOLIC PANEL
Anion gap: 10 (ref 5–15)
BUN: 16 mg/dL (ref 6–20)
CO2: 27 mmol/L (ref 22–32)
CREATININE: 0.62 mg/dL (ref 0.44–1.00)
Calcium: 9.6 mg/dL (ref 8.9–10.3)
Chloride: 104 mmol/L (ref 101–111)
GFR calc Af Amer: >60 mL/min (ref >60)
Glucose, Bld: 103 mg/dL — ABNORMAL HIGH (ref 65–99)
Potassium: 3 mmol/L — ABNORMAL LOW (ref 3.5–5.1)
SODIUM: 141 mmol/L (ref 135–145)

## 2015-11-27 LAB — TROPONIN I: Troponin I: <0.03 ng/mL (ref <0.031)

## 2015-11-27 NOTE — ED Notes (Signed)
Pt presents with mid sternal chest pain started yesterday while sitting, states it comes and goes and is sharp/stabbing in nature radiating into her back.

## 2015-11-27 NOTE — ED Provider Notes (Signed)
[facility] Emergency Department Provider Note  ____________________________________________  Time seen: Approximately 6 PM  I have reviewed the triage vital signs and the nursing notes.   HISTORY  Chief Complaint Chest Pain    HPI CHACE FLEER is a 70 y.o. female with a history of hypertension who is presenting today with chest pain starting 7 PM yesterday. She says that she has had 4 episodes to the center of her chest that radiates through to her back. She says that the pain feels like an electric shock and only lasts a meters second or 2. She denies any shortness of breath, sweating, nausea or vomiting. She denies any history of smoking or heart disease in her family. She denies any pain at this time. Denies any pain with exertion.Denies any recent lifting or injury to the chest.   Past Medical History  Diagnosis Date  . Hypertension     Patient Active Problem List   Diagnosis Date Noted  . Cholelithiasis 03/28/2015    Past Surgical History  Procedure Laterality Date  . Cholecystectomy N/A 03/29/2015    Procedure: LAPAROSCOPIC CHOLECYSTECTOMY;  Surgeon: Marlyce Huge, MD;  Location: [facility] ORS;  Service: General;  Laterality: N/A;    Current Outpatient Rx  Name  Route  Sig  Dispense  Refill  . hydrochlorothiazide (HYDRODIURIL) 25 MG tablet   Oral   Take 25 mg by mouth daily.           Allergies Review of patient's allergies indicates no known allergies.  Family History  Problem Relation Age of Onset  . Cancer Mother     Kidney    Social History Social History  Substance Use Topics  . Smoking status: Never Smoker   . Smokeless tobacco: Never Used  . Alcohol Use: No    Review of Systems Constitutional: No fever/chills Eyes: No visual changes. ENT: No sore throat. Cardiovascular: As above Respiratory: Denies shortness of breath. Gastrointestinal: No abdominal pain.  No nausea, no vomiting.  No diarrhea.  No  constipation. Genitourinary: Negative for dysuria. Musculoskeletal: Negative for back pain. Skin: Negative for rash. Neurological: Negative for headaches, focal weakness or numbness.  10-point ROS otherwise negative.  ____________________________________________   PHYSICAL EXAM:  VITAL SIGNS: ED Triage Vitals  Enc Vitals Group     BP 11/27/15 1425 154/71 mmHg     Pulse Rate 11/27/15 1425 84     Resp 11/27/15 1425 18     Temp 11/27/15 1425 97.9 F (36.6 C)     Temp Source 11/27/15 1425 Oral     SpO2 11/27/15 1425 95 %     Weight 11/27/15 1425 175 lb (79.379 kg)     Height 11/27/15 1425 5' (1.524 m)     Head Cir --      Peak Flow --      Pain Score 11/27/15 1439 5     Pain Loc --      Pain Edu? --      Excl. in [place]? --     Constitutional: Alert and oriented. Well appearing and in no acute distress. Eyes: Conjunctivae are normal. PERRL. EOMI. Head: Atraumatic. Nose: No congestion/rhinnorhea. Mouth/Throat: Mucous membranes are moist.  Neck: No stridor.   Cardiovascular: Normal rate, regular rhythm. Grossly normal heart sounds.  Good peripheral circulation. Has tenderness palpation of anterior chest over the sternum distally but says this pain feels different than the pain that she came in for. Respiratory: Normal respiratory effort.  No retractions. Lungs CTAB. Gastrointestinal:  Soft and nontender. No distention. No abdominal bruits. No CVA tenderness. Musculoskeletal: No lower extremity tenderness nor edema.  No joint effusions. Neurologic:  Normal speech and language. No gross focal neurologic deficits are appreciated. No gait instability. Skin:  Skin is warm, dry and intact. No rash noted. Psychiatric: Mood and affect are normal. Speech and behavior are normal.  ____________________________________________   LABS (all labs ordered are listed, but only abnormal results are displayed)  Labs Reviewed  BASIC METABOLIC PANEL - Abnormal; Notable for the following:     Potassium 3.0 (*)    Glucose, Bld 103 (*)    All other components within normal limits  CBC - Abnormal; Notable for the following:    RBC 5.27 (*)    All other components within normal limits  TROPONIN I   ____________________________________________  EKG  ED ECG REPORT I, Doran Stabler, the attending physician, personally viewed and interpreted this ECG.   Date: 11/27/2015  EKG Time: 1425  Rate: 81  Rhythm: normal EKG, normal sinus rhythm  Axis: Normal axis  Intervals:none  ST&T Change: No ST segment elevation or depression. No abnormal T-wave inversion.  ____________________________________________  RADIOLOGY  No acute cardiopulmonary disease. ____________________________________________   PROCEDURES   ____________________________________________   INITIAL IMPRESSION / ASSESSMENT AND PLAN / ED COURSE  Pertinent labs & imaging results that were available during my care of the patient were reviewed by me and considered in my medical decision making (see chart for details).  ----------------------------------------- 6:19 PM on 11/27/2015 -----------------------------------------  Patient with a heart score of 3. Very atypical story for MI or ACS. Also with normal vital signs and very unlikely for this to be a pulmonary embolus. Because the patient is 70 years old, female and with hypertension I recommended to the patient as well as her daughter who is at the bedside of the patient follow up with cardiology. I will give them the phone number for the on-call cardiologist and they know to call first thing in the morning to follow-up within 72 hours. The patient will be discharged home. Because the pain was also reproducible, I recommended muscle cream such as Aspercreme or icy hot. Patient and the daughter understand this plan and are willing to comply. ____________________________________________   FINAL CLINICAL IMPRESSION(S) / ED DIAGNOSES  Chest  pain.    Orbie Pyo, MD 11/27/15 [phone]

## 2015-12-14 ENCOUNTER — Ambulatory Visit (INDEPENDENT_AMBULATORY_CARE_PROVIDER_SITE_OTHER): Payer: Medicare Other | Admitting: Cardiology

## 2015-12-14 ENCOUNTER — Ambulatory Visit: Payer: Medicare Other | Admitting: Cardiology

## 2015-12-14 ENCOUNTER — Encounter: Payer: Self-pay | Admitting: Cardiology

## 2015-12-14 VITALS — BP 152/82 | HR 65 | Ht 60.0 in | Wt 176.5 lb

## 2015-12-14 DIAGNOSIS — R079 Chest pain, unspecified: Secondary | ICD-10-CM

## 2015-12-14 DIAGNOSIS — I10 Essential (primary) hypertension: Secondary | ICD-10-CM

## 2015-12-14 MED ORDER — AMLODIPINE BESYLATE 5 MG PO TABS: 5.0000 mg | ORAL_TABLET | Freq: Every day | ORAL | Status: DC

## 2015-12-14 NOTE — Progress Notes (Signed)
Cardiology Office Note   Date:  12/14/2015   ID:  Anne Castillo, DOB [birth year], MRN [id number]  Referring Doctor:  Marguerita Merles, MD   Cardiologist:   Wende Bushy, MD   Reason for consultation:  Chief Complaint  Patient presents with  . other    F/u [facility] due to chest pain c/o elevated BP. Meds reviewed verbally with pt.      History of Present Illness: Anne Castillo is a 70 y.o. female who presents for  Chest pain. Onset was 3 weeks ago. Randomly occurring after dinner. Described as an electricity-like/pins and needles. 8 out of 10 in severity. Nonradiating. Lasted seconds at a time , but happen multiple times throughout the night, waking her up from sleep. She presented to the ER for said complaint. ruled out.  Here for further evaluation.   Patient also reports fluctuating blood pressure. She remembers during the time she had chest pain, her blood pressure was elevated in the 180s. So far, blood pressure usually in the 150s at home.   otherwise, patient denies headache, fever, cough, colds, shortness of breath, and nausea, diaphoresis, PND, orthopnea, edema.   ROS:  Please see the history of present illness. Aside from mentioned under HPI, all other systems are reviewed and negative.     Past Medical History  Diagnosis Date  . Hypertension     Past Surgical History  Procedure Laterality Date  . Cholecystectomy N/A 03/29/2015    Procedure: LAPAROSCOPIC CHOLECYSTECTOMY;  Surgeon: Marlyce Huge, MD;  Location: [facility] ORS;  Service: General;  Laterality: N/A;     reports that she has never smoked. She has never used smokeless tobacco. She reports that she does not drink alcohol or use illicit drugs.   family history includes Cancer in her mother.   Current Outpatient Prescriptions  Medication Sig Dispense Refill  . hydrochlorothiazide (HYDRODIURIL) 25 MG tablet Take 25 mg by mouth daily.    . Multiple Vitamin (MULTIVITAMIN) capsule Take 1 capsule by mouth  daily.     No current facility-administered medications for this visit.    Allergies: Review of patient's allergies indicates no known allergies.    PHYSICAL EXAM: VS:  BP 152/82 mmHg  Pulse 65  Ht 5' (1.524 m)  Wt 176 lb 8 oz (80.06 kg)  BMI 34.47 kg/m2 , Body mass index is 34.47 kg/(m^2). Wt Readings from Last 3 Encounters:  12/14/15 176 lb 8 oz (80.06 kg)  11/27/15 175 lb (79.379 kg)  04/06/15 173 lb (78.472 kg)    GENERAL:  well developed, well nourished, obese, not in acute distress HEENT: normocephalic, pink conjunctivae, anicteric sclerae, no xanthelasma, normal dentition, oropharynx clear NECK:  no neck vein engorgement, JVP normal, no hepatojugular reflux, carotid upstroke brisk and symmetric, no bruit, no thyromegaly, no lymphadenopathy LUNGS:  good respiratory effort, clear to auscultation bilaterally CV:  PMI not displaced, no thrills, no lifts, S1 and S2 within normal limits, no palpable S3 or S4, no murmurs, no rubs, no gallops ABD:  Soft, nontender, nondistended, normoactive bowel sounds, no abdominal aortic bruit, no hepatomegaly, no splenomegaly MS: nontender back, no kyphosis, no scoliosis, no joint deformities EXT:  2+ DP/PT pulses, no edema, no varicosities, no cyanosis, no clubbing SKIN: warm, nondiaphoretic, normal turgor, no ulcers NEUROPSYCH: alert, oriented to person, place, and time, sensory/motor grossly intact, normal mood, appropriate affect  Recent Labs: 03/29/2015: ALT 18 11/27/2015: BUN 16; Creatinine, Ser 0.62; Hemoglobin 14.7; Platelets 225; Potassium 3.0*; Sodium 141   Lipid  Panel No results found for: CHOL, TRIG, HDL, CHOLHDL, VLDL, LDLCALC, LDLDIRECT   Other studies Reviewed:  EKG:  EKG is ordered today. 12/14/2015 The ekg ordered today was personally reviewed by me and it reveals  Sinus rhythm, 65 bpm , poor R-wave progression.  Additional studies/ records that were reviewed personally reviewed by me today include:  None  available  ASSESSMENT AND PLAN:  1.  Chest pain  Patient has risk factors including hypertension, postmenopausal state. Recommend further assessment with stress echocardiogram and transthoracic echocardiogram. So far patient has had no recurrence. We'll continue to monitor.   Hypertension  Blood pressure elevated. Recommend to stop either chlorothiazide , especially in setting of  Hypokalemia. Recommend to start amlodipine 5 mg by mouth daily. Will up titrate if necessary. Continue blood pressure monitoring at home.  Current medicines are reviewed at length with the patient today.  The patient does not have concerns regarding medicines.  Labs/ tests ordered today include: Orders Placed This Encounter  Procedures  . EKG 12-Lead    I had a lengthy and detailed discussion with the patient regarding diagnoses, prognosis, diagnostic options, treatment options, and side effects of medications.   I counseled the patient on importance of lifestyle modification including heart healthy diet, regular physical activity.  Disposition:   FU with undersigned  After tests   Signed, Wende Bushy, MD  12/14/2015 11:00 AM    [place]

## 2015-12-14 NOTE — Patient Instructions (Addendum)
Medication Instructions:  Your physician has recommended you make the following change in your medication:  1. Stop taking your HCTZ. 2. Start taking amlodipine 5 mg once daily.    Labwork: Your physician recommends that you return for lab work in: 1 week for BMP  Date & Time: ______________________________________________________   Testing/Procedures: Your physician has requested that you have an echocardiogram. Echocardiography is a painless test that uses sound waves to create images of your heart. It provides your doctor with information about the size and shape of your heart and how well your heart's chambers and valves are working. This procedure takes approximately one hour. There are no restrictions for this procedure.  Date & Time:__________________________________________________________  Your physician has requested that you have a stress echocardiogram. For further information please visit [email or web address]. Please follow instruction sheet as given.  How to prepare for your Stress Echo test:  1. No caffeine for 24 hours prior to test 2. No smoking 24 hours prior to test. 3. Your medication may be taken with water.  If your doctor stopped a medication because of this test, do not take that medication. 4. Ladies, please do not wear dresses.  Skirts or pants are appropriate. Please wear a short sleeve shirt. 5. No perfume, cologne or lotion. 6. Wear comfortable walking shoes. No heels!        Date & Time: ___________________________________________________________   Follow-Up: Your physician recommends that you schedule a follow-up appointment after testing to review results.  Date & Time:_____________________________________________________________   Any Other Special Instructions Will Be Listed Below (If Applicable).     If you need a refill on your cardiac medications before your next appointment, please call your pharmacy.    Echocardiogram An  echocardiogram, or echocardiography, uses sound waves (ultrasound) to produce an image of your heart. The echocardiogram is simple, painless, obtained within a short period of time, and offers valuable information to your health care provider. The images from an echocardiogram can provide information such as:  Evidence of coronary artery disease (CAD).  Heart size.  Heart muscle function.  Heart valve function.  Aneurysm detection.  Evidence of a past heart attack.  Fluid buildup around the heart.  Heart muscle thickening.  Assess heart valve function. LET [facility] CARE PROVIDER KNOW ABOUT:  Any allergies you have.  All medicines you are taking, including vitamins, herbs, eye drops, creams, and over-the-counter medicines.  Previous problems you or members of your family have had with the use of anesthetics.  Any blood disorders you have.  Previous surgeries you have had.  Medical conditions you have.  Possibility of pregnancy, if this applies. BEFORE THE PROCEDURE  No special preparation is needed. Eat and drink normally.  PROCEDURE  7. In order to produce an image of your heart, gel will be applied to your chest and a wand-like tool (transducer) will be moved over your chest. The gel will help transmit the sound waves from the transducer. The sound waves will harmlessly bounce off your heart to allow the heart images to be captured in real-time motion. These images will then be recorded. 8. You may need an IV to receive a medicine that improves the quality of the pictures. AFTER THE PROCEDURE You may return to your normal schedule including diet, activities, and medicines, unless your health care provider tells you otherwise.   This information is not intended to replace advice given to you by your health care provider. Make sure you discuss any questions you have with  your health care provider.   Document Released: 09/20/2000 Document Revised: 10/14/2014 Document  Reviewed: 05/31/2013 Elsevier Interactive Patient Education 2016 [employer].     Exercise Stress Echocardiogram An exercise stress echocardiogram is a heart (cardiac) test used to check the function of your heart. This test may also be called an exercise stress echocardiography or stress echo. This stress test will check how well your heart muscle and valves are working and determine if your heart muscle is getting enough blood. You will exercise on a treadmill to naturally increase or stress the functioning of your heart.  An echocardiogram uses sound waves (ultrasound) to produce an image of your heart. If your heart does not work normally, it may indicate coronary artery disease with poor coronary blood supply. The coronary arteries are the arteries that bring blood and oxygen to your heart. LET [facility] CARE PROVIDER KNOW ABOUT:  Any allergies you have.  All medicines you are taking, including vitamins, herbs, eye drops, creams, and over-the-counter medicines.  Previous problems you or members of your family have had with the use of anesthetics.  Any blood disorders you have.  Previous surgeries you have had.  Medical conditions you have.  Possibility of pregnancy, if this applies. RISKS AND COMPLICATIONS Generally, this is a safe procedure. However, as with any procedure, complications can occur. Possible complications can include:  You develop pain or pressure in the following areas:  Chest.  Jaw or neck.  Between your shoulder blades.  Radiating down your left arm.  Dizziness or lightheadedness.  Shortness of breath.  Increased or irregular heartbeat.  Nausea or vomiting.  Heart attack (rare). BEFORE THE PROCEDURE 9. Avoid all forms of caffeine for 24 hours before your test or as directed by your health care provider. This includes coffee, tea (even decaffeinated tea), caffeinated sodas, chocolate, cocoa, and certain pain medicines. 10. Follow your health  care provider's instructions regarding eating and drinking before the test. 11. Take your medicines as directed at regular times with water unless instructed otherwise. Exceptions may include: 1. If you have diabetes, ask how you are to take your insulin or pills. It is common to adjust insulin dosing the morning of the test. 2. If you are taking beta-blocker medicines, it is important to talk to your health care provider about these medicines well before the date of your test. Taking beta-blocker medicines may interfere with the test. In some cases, these medicines need to be changed or stopped 24 hours or more before the test. 3. If you wear a nitroglycerin patch, it may need to be removed prior to the test. Ask your health care provider if the patch should be removed before the test. 12. If you use an inhaler for any breathing condition, bring it with you to the test. 13. If you are an outpatient, bring a snack so you can eat right after the stress phase of the test. 14. Do not smoke for 4 hours prior to the test or as directed by your health care provider. 15. Wear loose-fitting clothes and comfortable shoes for the test. This test involves walking on a treadmill. PROCEDURE   Multiple electrodes will be put on your chest. If needed, small areas of your chest may be shaved to get better contact with the electrodes. Once the electrodes are attached to your body, multiple wires will be attached to the electrodes, and your heart rate will be monitored.  You will have an echocardiogram done at rest.  To produce  this image of your heart, gel is applied to your chest, and a wand-like tool (transducer) is moved over the chest. The transducer sends the sound waves through the chest to create the moving images of your heart.  You may need an IV to receive a medication that improves the quality of the pictures.  You will then walk on a treadmill. The treadmill will be started at a slow pace. The  treadmill speed and incline will gradually be increased to raise your heart rate.  At the peak of exercise, the treadmill will be stopped. You will lie down immediately on a bed so that a second echocardiogram can be done to visualize your heart's motion with exercise.  The test usually takes 30-60 minutes to complete. AFTER THE PROCEDURE  Your heart rate and blood pressure will be monitored after the test.  You may return to your normal schedule, including diet, activities, and medicines, unless your health care provider tells you otherwise.   This information is not intended to replace advice given to you by your health care provider. Make sure you discuss any questions you have with your health care provider.   Document Released: 09/27/2004 Document Revised: 09/28/2013 Document Reviewed: 05/31/2013 Elsevier Interactive Patient Education [employer].

## 2015-12-21 ENCOUNTER — Other Ambulatory Visit (INDEPENDENT_AMBULATORY_CARE_PROVIDER_SITE_OTHER): Payer: Medicare Other

## 2015-12-21 DIAGNOSIS — R079 Chest pain, unspecified: Secondary | ICD-10-CM | POA: Diagnosis not present

## 2015-12-22 LAB — BASIC METABOLIC PANEL
BUN/Creatinine Ratio: 26 (ref 11–26)
BUN: 15 mg/dL (ref 8–27)
CALCIUM: 9.3 mg/dL (ref 8.7–10.3)
CO2: 25 mmol/L (ref 18–29)
CREATININE: 0.57 mg/dL (ref 0.57–1.00)
Chloride: 101 mmol/L (ref 96–106)
GFR calc Af Amer: 110 mL/min/{1.73_m2} (ref >59)
GFR, EST NON AFRICAN AMERICAN: 96 mL/min/{1.73_m2} (ref >59)
Glucose: 106 mg/dL — ABNORMAL HIGH (ref 65–99)
Potassium: 4.6 mmol/L (ref 3.5–5.2)
Sodium: 140 mmol/L (ref 134–144)

## 2015-12-28 ENCOUNTER — Ambulatory Visit (INDEPENDENT_AMBULATORY_CARE_PROVIDER_SITE_OTHER): Payer: Medicare Other

## 2015-12-28 ENCOUNTER — Other Ambulatory Visit: Payer: Self-pay

## 2015-12-28 DIAGNOSIS — R079 Chest pain, unspecified: Secondary | ICD-10-CM | POA: Diagnosis not present

## 2016-01-02 ENCOUNTER — Ambulatory Visit (INDEPENDENT_AMBULATORY_CARE_PROVIDER_SITE_OTHER): Payer: Medicare Other

## 2016-01-02 DIAGNOSIS — R079 Chest pain, unspecified: Secondary | ICD-10-CM

## 2016-01-02 LAB — ECHOCARDIOGRAM STRESS TEST
CHL CUP MPHR: 152 {beats}/min
CSEPEW: 6.6 METS
Exercise duration (min): 4 min
Exercise duration (sec): 42 s
Peak HR: 144 {beats}/min
Percent HR: 94 %
Rest HR: 80 {beats}/min

## 2016-01-04 ENCOUNTER — Encounter (INDEPENDENT_AMBULATORY_CARE_PROVIDER_SITE_OTHER): Payer: Self-pay

## 2016-01-04 ENCOUNTER — Ambulatory Visit (INDEPENDENT_AMBULATORY_CARE_PROVIDER_SITE_OTHER): Payer: Medicare Other | Admitting: Cardiology

## 2016-01-04 ENCOUNTER — Encounter: Payer: Self-pay | Admitting: Cardiology

## 2016-01-04 VITALS — BP 130/80 | HR 69 | Ht 60.0 in | Wt 178.0 lb

## 2016-01-04 DIAGNOSIS — R079 Chest pain, unspecified: Secondary | ICD-10-CM | POA: Diagnosis not present

## 2016-01-04 DIAGNOSIS — I10 Essential (primary) hypertension: Secondary | ICD-10-CM

## 2016-01-04 MED ORDER — AMLODIPINE BESYLATE 5 MG PO TABS: 5.0000 mg | ORAL_TABLET | Freq: Every day | ORAL | Status: DC

## 2016-01-04 NOTE — Progress Notes (Signed)
Cardiology Office Note   Date:  01/04/2016   ID:  Anne, Castillo [birth year], MRN [id number]  Referring Doctor:  Marguerita Merles, MD   Cardiologist:   Wende Bushy, MD   Reason for consultation:  Chief Complaint  Patient presents with  . other    F/u stress echo no complaints today. Meds reviewed verbally with pt.      History of Present Illness: Anne Castillo is a 70 y.o. female who presents for  Follow-up for chest pain, after stress test and echocardiogram.  Patient has had no recurrence of symptoms. Patient reports that her blood pressure is much better on the amlodipine. She has stopped taking the HCTZ. She reports that the top number is usually in the 130s.  patient denies headache, fever, cough, colds, shortness of breath, and nausea, diaphoresis, PND, orthopnea, edema.   ROS:  Please see the history of present illness. Aside from mentioned under HPI, all other systems are reviewed and negative.     Past Medical History  Diagnosis Date  . Hypertension     Past Surgical History  Procedure Laterality Date  . Cholecystectomy N/A 03/29/2015    Procedure: LAPAROSCOPIC CHOLECYSTECTOMY;  Surgeon: Marlyce Huge, MD;  Location: [facility] ORS;  Service: General;  Laterality: N/A;     reports that she has never smoked. She has never used smokeless tobacco. She reports that she does not drink alcohol or use illicit drugs.   family history includes Cancer in her mother.   Current Outpatient Prescriptions  Medication Sig Dispense Refill  . amLODipine (NORVASC) 5 MG tablet Take 1 tablet (5 mg total) by mouth daily. 30 tablet 11  . Multiple Vitamin (MULTIVITAMIN) capsule Take 1 capsule by mouth daily.     No current facility-administered medications for this visit.    Allergies: Review of patient's allergies indicates no known allergies.    PHYSICAL EXAM: VS:  BP 130/80 mmHg  Pulse 69  Ht 5' (1.524 m)  Wt 178 lb (80.74 kg)  BMI 34.76 kg/m2 , Body mass  index is 34.76 kg/(m^2). Wt Readings from Last 3 Encounters:  01/04/16 178 lb (80.74 kg)  12/14/15 176 lb 8 oz (80.06 kg)  11/27/15 175 lb (79.379 kg)    GENERAL:  well developed, well nourished, obese, not in acute distress HEENT: normocephalic, pink conjunctivae, anicteric sclerae, no xanthelasma, normal dentition, oropharynx clear NECK:  no neck vein engorgement, JVP normal, no hepatojugular reflux, carotid upstroke brisk and symmetric, no bruit, no thyromegaly, no lymphadenopathy LUNGS:  good respiratory effort, clear to auscultation bilaterally CV:  PMI not displaced, no thrills, no lifts, S1 and S2 within normal limits, no palpable S3 or S4, no murmurs, no rubs, no gallops ABD:  Soft, nontender, nondistended, normoactive bowel sounds, no abdominal aortic bruit, no hepatomegaly, no splenomegaly MS: nontender back, no kyphosis, no scoliosis, no joint deformities EXT:  2+ DP/PT pulses, no edema, no varicosities, no cyanosis, no clubbing SKIN: warm, nondiaphoretic, normal turgor, no ulcers NEUROPSYCH: alert, oriented to person, place, and time, sensory/motor grossly intact, normal mood, appropriate affect  Recent Labs: 03/29/2015: ALT 18 11/27/2015: Hemoglobin 14.7; Platelets 225 12/21/2015: BUN 15; Creatinine, Ser 0.57; Potassium 4.6; Sodium 140 K improved  Lipid Panel No results found for: CHOL, TRIG, HDL, CHOLHDL, VLDL, LDLCALC, LDLDIRECT   Other studies Reviewed:  EKG: The ekg from 12/14/2015 was personally reviewed by me and it reveals  Sinus rhythm, 65 bpm , poor R-wave progression.  Additional studies/ records that were reviewed  personally reviewed by me today include:   Echocardiogram 12/28/2015: - Left ventricle: The cavity size was normal. Systolic function was  normal. The estimated ejection fraction was in the range of 60%  to 65%. Wall motion was normal; there were no regional wall  motion abnormalities. Left ventricular diastolic function  parameters were normal. -  Left atrium: The atrium was normal in size. - Right ventricle: Systolic function was normal. - Pulmonary arteries: PA peak pressure: 37 mm Hg (S).  Stress echo 01/02/2016: Study Conclusions - Stress ECG conclusions: There were no stress arrhythmias or  conduction abnormalities. The stress ECG was negative for  ischemia. - Staged echo: Normal echo stress - Peak stress: The estimated LV ejection fraction was 70-75%. Impressions: - Normal study after maximal exercise. Target heart rate achieved.  Hypertensive at rest and with stress.    ASSESSMENT AND PLAN:  Chest pain  Patient has risk factors including hypertension, postmenopausal state.  No recurrence of symptoms.  Echocardiogram from 12/28/2015 showed normal systolic function.  Stress echocardiogram from 12/25/2015 was negative. Patient was noted to be hypertensive at rest and with stress. Discussed the results of these tests with the patient and her family, in detail. Patient was reassured. Inform them that a normal stress echocardiogram portends a low risk for clinically significant CAD. Patient to continue to monitor symptoms and follow-up with [place] when necessary.   Hypertension  Blood pressure better on the amlodipine. On last visit,  recommend to stop HCTZ, especially in setting of  Hypokalemia. Continue amlodipine 5 mg by mouth daily. Continue blood pressure monitoring at home. Lifestyle changes recommended.  Current medicines are reviewed at length with the patient today.  The patient does not have concerns regarding medicines.  Labs/ tests ordered today include: No orders of the defined types were placed in this encounter.    I had a lengthy and detailed discussion with the patient regarding diagnoses, prognosis, diagnostic options, treatment options, and side effects of medications.   I counseled the patient on importance of lifestyle modification including heart healthy diet, regular physical  activity.  Disposition:   FU with undersigned when necessary Patient to follow-up with PCP for hypertension  Signed, Wende Bushy, MD  01/04/2016 9:38 AM    [place]

## 2016-01-04 NOTE — Patient Instructions (Signed)
Medication Instructions:  Your physician recommends that you continue on your current medications as directed. Please refer to the Current Medication list given to you today.   Labwork: None Ordered  Testing/Procedures: None Ordered  Follow-Up: Your physician recommends that you schedule a follow-up appointment as needed.    Any Other Special Instructions Will Be Listed Below (If Applicable).     If you need a refill on your cardiac medications before your next appointment, please call your pharmacy.

## 2016-02-22 ENCOUNTER — Ambulatory Visit (INDEPENDENT_AMBULATORY_CARE_PROVIDER_SITE_OTHER): Payer: Medicare Other | Admitting: General Surgery

## 2016-02-22 ENCOUNTER — Encounter: Payer: Self-pay | Admitting: General Surgery

## 2016-02-22 VITALS — BP 140/78 | HR 68 | Resp 14 | Ht 60.0 in | Wt 177.0 lb

## 2016-02-22 DIAGNOSIS — K439 Ventral hernia without obstruction or gangrene: Secondary | ICD-10-CM

## 2016-02-22 NOTE — Patient Instructions (Addendum)
Hernia, Adult A hernia is the bulging of an organ or tissue through a weak spot in the muscles of the abdomen (abdominal wall). Hernias develop most often near the navel or groin. There are many kinds of hernias. Common kinds include:  Femoral hernia. This kind of hernia develops under the groin in the upper thigh area.  Inguinal hernia. This kind of hernia develops in the groin or scrotum.  Umbilical hernia. This kind of hernia develops near the navel.  Hiatal hernia. This kind of hernia causes part of the stomach to be pushed up into the chest.  Incisional hernia. This kind of hernia bulges through a scar from an abdominal surgery. CAUSES This condition may be caused by:  Heavy lifting.  Coughing over a long period of time.  Straining to have a bowel movement.  An incision made during an abdominal surgery.  A birth defect (congenital defect).  Excess weight or obesity.  Smoking.  Poor nutrition.  Cystic fibrosis.  Excess fluid in the abdomen.  Undescended testicles. SYMPTOMS Symptoms of a hernia include:  A lump on the abdomen. This is the first sign of a hernia. The lump may become more obvious with standing, straining, or coughing. It may get bigger over time if it is not treated or if the condition causing it is not treated.  Pain. A hernia is usually painless, but it may become painful over time if treatment is delayed. The pain is usually dull and may get worse with standing or lifting heavy objects. Sometimes a hernia gets tightly squeezed in the weak spot (strangulated) or stuck there (incarcerated) and causes additional symptoms. These symptoms may include:  Vomiting.  Nausea.  Constipation.  Irritability. DIAGNOSIS A hernia may be diagnosed with:  A physical exam. During the exam your health care provider may ask you to cough or to make a specific movement, because a hernia is usually more visible when you move.  Imaging tests. These can  include:  X-rays.  Ultrasound.  CT scan. TREATMENT A hernia that is small and painless may not need to be treated. A hernia that is large or painful may be treated with surgery. Inguinal hernias may be treated with surgery to prevent incarceration or strangulation. Strangulated hernias are always treated with surgery, because lack of blood to the trapped organ or tissue can cause it to die. Surgery to treat a hernia involves pushing the bulge back into place and repairing the weak part of the abdomen. HOME CARE INSTRUCTIONS  Avoid straining.  Do not lift anything heavier than 10 lb (4.5 kg).  Lift with your leg muscles, not your back muscles. This helps avoid strain.  When coughing, try to cough gently.  Prevent constipation. Constipation leads to straining with bowel movements, which can make a hernia worse or cause a hernia repair to break down. You can prevent constipation by:  Eating a high-fiber diet that includes plenty of fruits and vegetables.  Drinking enough fluids to keep your urine clear or pale yellow. Aim to drink 6-8 glasses of water per day.  Using a stool softener as directed by your health care provider.  Lose weight, if you are overweight.  Do not use any tobacco products, including cigarettes, chewing tobacco, or electronic cigarettes. If you need help quitting, ask your health care provider.  Keep all follow-up visits as directed by your health care provider. This is important. Your health care provider may need to monitor your condition. SEEK MEDICAL CARE IF:  You have   swelling, redness, and pain in the affected area.  Your bowel habits change. SEEK IMMEDIATE MEDICAL CARE IF:  You have a fever.  You have abdominal pain that is getting worse.  You feel nauseous or you vomit.  You cannot push the hernia back in place by gently pressing on it while you are lying down.  The hernia:  Changes in shape or size.  Is stuck outside the  abdomen.  Becomes discolored.  Feels hard or tender.   This information is not intended to replace advice given to you by your health care provider. Make sure you discuss any questions you have with your health care provider.   Document Released: 09/23/2005 Document Revised: 10/14/2014 Document Reviewed: 08/03/2014 Elsevier Interactive Patient Education 2016 [employer].  Patient's surgery has been scheduled for 03-19-16 at [facility].

## 2016-02-22 NOTE — Progress Notes (Signed)
Patient ID: Anne Castillo, female   DOB: [birth year], 70 y.o.   MRN: [id number]  Chief Complaint  Patient presents with  . Other    ventral hernia    HPI Anne Castillo is a 70 y.o. female here today for a evaluation of ventral hernia. Patient noticed this area soon after she had her cholecystectomy on 03/29/15. She reports the area bulges out when she sneezes. She reports that she will have pain in the area occasionally, mostly with bending to the side and straightening up from bending over. She denies any GI problems. She states that the area has gotten larger.   She is here today with [facility] the interpreter.  HPI  Past Medical History  Diagnosis Date  . Hypertension     Past Surgical History  Procedure Laterality Date  . Cholecystectomy N/A 03/29/2015    Procedure: LAPAROSCOPIC CHOLECYSTECTOMY;  Surgeon: Marlyce Huge, MD;  Location: [facility] ORS;  Service: General;  Laterality: N/A;    Family History  Problem Relation Age of Onset  . Kidney cancer Mother     Social History Social History  Substance Use Topics  . Smoking status: Never Smoker   . Smokeless tobacco: Never Used  . Alcohol Use: No    No Known Allergies  Current Outpatient Prescriptions  Medication Sig Dispense Refill  . amLODipine (NORVASC) 5 MG tablet Take 1 tablet (5 mg total) by mouth daily. 30 tablet 11  . aspirin EC 81 MG tablet Take 81 mg by mouth.    . Calcium Carb-Ergocalciferol 500-200 MG-UNIT TABS Take 1 tablet by mouth daily.    . hydrochlorothiazide (HYDRODIURIL) 25 MG tablet TAKE 1 TABLET (25 MG TOTAL) BY MOUTH DAILY.  3  . Multiple Vitamin (MULTIVITAMIN) capsule Take 1 capsule by mouth daily.    Marland Kitchen VITAMIN A OP Apply to eye.     No current facility-administered medications for this visit.    Review of Systems Review of Systems  Constitutional: Negative.   Respiratory: Negative.   Cardiovascular: Negative.     Blood pressure 140/78, pulse 68, resp. rate 14, height 5' (1.524 m),  weight 177 lb (80.287 kg).  Physical Exam Physical Exam  Constitutional: She is oriented to person, place, and time. She appears well-developed and well-nourished.  Eyes: Conjunctivae are normal. No scleral icterus.  Neck: Neck supple.  Cardiovascular: Normal rate, regular rhythm and normal heart sounds.   Pulmonary/Chest: Effort normal and breath sounds normal.  Abdominal: Soft. Normal appearance and bowel sounds are normal. There is no hepatomegaly. There is no tenderness. A hernia is present. Hernia confirmed positive in the ventral area.    Lymphadenopathy:    She has no cervical adenopathy.  Neurological: She is alert and oriented to person, place, and time.  Skin: Skin is warm and dry.    Data Reviewed Operative note reviewed. Fascial defect was closed, likely with 0 Vicryl based on material list review.  Assessment    Ventral hernia.    Plan    Clinical exam suggests a small fascial defect which will likely be managed by primary repair. Possibility of mesh placement was discussed.    Hernia precautions and incarceration were discussed with the patient. If they develop symptoms of an incarcerated hernia, they were encouraged to seek prompt medical attention.  I have recommended repair of the hernia possibly using mesh on an outpatient basis in the near future. The risk of infection was reviewed. The role of prosthetic mesh to minimize the risk of recurrence  was reviewed.  Patient's surgery has been scheduled for 03-19-16 at [facility]. It is okay for patient to continue 81 mg aspirin once daily.   PCP:  Marygrace Drought This has been scribed by Lesly Rubenstein LPN     Robert Bellow 02/23/2016, 3:34 PM

## 2016-02-23 DIAGNOSIS — K439 Ventral hernia without obstruction or gangrene: Secondary | ICD-10-CM | POA: Insufficient documentation

## 2016-02-23 NOTE — H&P (Signed)
HPI  Anne Castillo is a 70 y.o. female here today for a evaluation of ventral hernia. Patient noticed this area soon after she had her cholecystectomy on 03/29/15. She reports the area bulges out when she sneezes. She reports that she will have pain in the area occasionally, mostly with bending to the side and straightening up from bending over. She denies any GI problems. She states that the area has gotten larger.  She is here today with [facility] the interpreter.  HPI  Past Medical History   Diagnosis  Date   .  Hypertension     Past Surgical History   Procedure  Laterality  Date   .  Cholecystectomy  N/A  03/29/2015     Procedure: LAPAROSCOPIC CHOLECYSTECTOMY; Surgeon: Marlyce Huge, MD; Location: [facility] ORS; Service: General; Laterality: N/A;    Family History   Problem  Relation  Age of Onset   .  Kidney cancer  Mother     Social History  Social History   Substance Use Topics   .  Smoking status:  Never Smoker   .  Smokeless tobacco:  Never Used   .  Alcohol Use:  No    No Known Allergies  Current Outpatient Prescriptions   Medication  Sig  Dispense  Refill   .  amLODipine (NORVASC) 5 MG tablet  Take 1 tablet (5 mg total) by mouth daily.  30 tablet  11   .  aspirin EC 81 MG tablet  Take 81 mg by mouth.     .  Calcium Carb-Ergocalciferol 500-200 MG-UNIT TABS  Take 1 tablet by mouth daily.     .  hydrochlorothiazide (HYDRODIURIL) 25 MG tablet  TAKE 1 TABLET (25 MG TOTAL) BY MOUTH DAILY.   3   .  Multiple Vitamin (MULTIVITAMIN) capsule  Take 1 capsule by mouth daily.     Marland Kitchen  VITAMIN A OP  Apply to eye.      No current facility-administered medications for this visit.    Review of Systems  Review of Systems  Constitutional: Negative.  Respiratory: Negative.  Cardiovascular: Negative.   Blood pressure 140/78, pulse 68, resp. rate 14, height 5' (1.524 m), weight 177 lb (80.287 kg).  Physical Exam  Physical Exam  Constitutional: She is oriented to person, place, and time.  She appears well-developed and well-nourished.  Eyes: Conjunctivae are normal. No scleral icterus.  Neck: Neck supple.  Cardiovascular: Normal rate, regular rhythm and normal heart sounds.  Pulmonary/Chest: Effort normal and breath sounds normal.  Abdominal: Soft. Normal appearance and bowel sounds are normal. There is no hepatomegaly. There is no tenderness. A hernia is present. Hernia confirmed positive in the ventral area.    Lymphadenopathy:  She has no cervical adenopathy.  Neurological: She is alert and oriented to person, place, and time.  Skin: Skin is warm and dry.   Data Reviewed  Operative note reviewed. Fascial defect was closed, likely with 0 Vicryl based on material list review.  Assessment   Ventral hernia.   Plan   Clinical exam suggests a small fascial defect which will likely be managed by primary repair. Possibility of mesh placement was discussed.  Hernia precautions and incarceration were discussed with the patient. If they develop symptoms of an incarcerated hernia, they were encouraged to seek prompt medical attention.  I have recommended repair of the hernia possibly using mesh on an outpatient basis in the near future. The risk of infection was reviewed. The role of prosthetic mesh  to minimize the risk of recurrence was reviewed.  Patient's surgery has been scheduled for 03-19-16 at [facility]. It is okay for patient to continue 81 mg aspirin once daily.  PCP: Marygrace Drought  This has been scribed by Lesly Rubenstein LPN  Robert Bellow  02/23/2016, 3:34 PM

## 2016-02-26 ENCOUNTER — Telehealth: Payer: Self-pay | Admitting: *Deleted

## 2016-02-26 NOTE — Telephone Encounter (Signed)
Patient would like to reschedule her surgery from 03-19-16 to 03-25-16 at [facility].   Fax sheet sent to [facility] regarding date change.

## 2016-03-12 ENCOUNTER — Encounter
Admission: RE | Admit: 2016-03-12 | Discharge: 2016-03-12 | Disposition: A | Discharge status: Home / Self Care | Payer: Medicare Other | Source: Ambulatory Visit | Attending: General Surgery | Admitting: General Surgery

## 2016-03-12 DIAGNOSIS — Z01818 Encounter for other preprocedural examination: Secondary | ICD-10-CM | POA: Insufficient documentation

## 2016-03-12 HISTORY — DX: Pain, unspecified: R52

## 2016-03-12 HISTORY — DX: Anxiety disorder, unspecified: F41.9

## 2016-03-12 NOTE — Patient Instructions (Signed)
  Your procedure is scheduled on: 03/25/16 Mom Report to Same Day Surgery 2nd floor medical mall To find out your arrival time please call [phone] between 1PM - 3PM on   Remember: Instructions that are not followed completely may result in serious medical risk, up to and including death, or upon the discretion of your surgeon and anesthesiologist your surgery may need to be rescheduled.    _x___ 1. Do not eat food or drink liquids after midnight. No gum chewing or hard candies.     __x__ 2. No Alcohol for 24 hours before or after surgery.   ____ 3. Bring all medications with you on the day of surgery if instructed.    __x__ 4. Notify your doctor if there is any change in your medical condition     (cold, fever, infections).     Do not wear jewelry, make-up, hairpins, clips or nail polish.  Do not wear lotions, powders, or perfumes. You may wear deodorant.  Do not shave 48 hours prior to surgery. Men may shave face and neck.  Do not bring valuables to the hospital.    [facility] is not responsible for any belongings or valuables.               Contacts, dentures or bridgework may not be worn into surgery.  Leave your suitcase in the car. After surgery it may be brought to your room.  For patients admitted to the hospital, discharge time is determined by your treatment team.   Patients discharged the day of surgery will not be allowed to drive home.    Please read over the following fact sheets that you were given:   [facility] Preparing for Surgery and or MRSA Information   _x___ Take these medicines the morning of surgery with A SIP OF WATER:    1. amLODipine (NORVASC) 5 MG tablet  2.  3.  4.  5.  6.  ____ Fleet Enema (as directed)   _x___ Use CHG Soap or sage wipes as directed on instruction sheet   ____ Use inhalers on the day of surgery and bring to hospital day of surgery  ____ Stop metformin 2 days prior to surgery    ____ Take 1/2 of usual insulin dose the  night before surgery and none on the morning of           surgery.   _x___ Stop aspirin or coumadin, or plavix Stop aspirin 1 week before surgery  _x__ Stop Anti-inflammatories such as Advil, Aleve, Ibuprofen, Motrin, Naproxen,          Naprosyn, Goodies powders or aspirin products. Ok to take Tylenol.   ____ Stop supplements until after surgery.    ____ Bring C-Pap to the hospital.

## 2016-03-25 ENCOUNTER — Ambulatory Visit: Payer: Medicare Other | Admitting: Anesthesiology

## 2016-03-25 ENCOUNTER — Encounter
Admission: RE | Disposition: A | Discharge status: Home / Self Care | Payer: Self-pay | Source: Ambulatory Visit | Attending: General Surgery

## 2016-03-25 ENCOUNTER — Encounter: Payer: Self-pay | Admitting: *Deleted

## 2016-03-25 ENCOUNTER — Ambulatory Visit
Admission: RE | Admit: 2016-03-25 | Discharge: 2016-03-25 | Disposition: A | Discharge status: Home / Self Care | Payer: Medicare Other | Source: Ambulatory Visit | Attending: General Surgery | Admitting: General Surgery

## 2016-03-25 DIAGNOSIS — Z9049 Acquired absence of other specified parts of digestive tract: Secondary | ICD-10-CM | POA: Diagnosis not present

## 2016-03-25 DIAGNOSIS — Z79899 Other long term (current) drug therapy: Secondary | ICD-10-CM | POA: Insufficient documentation

## 2016-03-25 DIAGNOSIS — K439 Ventral hernia without obstruction or gangrene: Secondary | ICD-10-CM

## 2016-03-25 DIAGNOSIS — K432 Incisional hernia without obstruction or gangrene: Secondary | ICD-10-CM | POA: Insufficient documentation

## 2016-03-25 DIAGNOSIS — I10 Essential (primary) hypertension: Secondary | ICD-10-CM | POA: Insufficient documentation

## 2016-03-25 DIAGNOSIS — F419 Anxiety disorder, unspecified: Secondary | ICD-10-CM | POA: Insufficient documentation

## 2016-03-25 DIAGNOSIS — Z7982 Long term (current) use of aspirin: Secondary | ICD-10-CM | POA: Insufficient documentation

## 2016-03-25 HISTORY — PX: VENTRAL HERNIA REPAIR: SHX424

## 2016-03-25 HISTORY — PX: HERNIA REPAIR: SHX51

## 2016-03-25 SURGERY — REPAIR, HERNIA, VENTRAL
Anesthesia: General | Wound class: Clean

## 2016-03-25 MED ORDER — KETOROLAC TROMETHAMINE 30 MG/ML IJ SOLN
INTRAMUSCULAR | Status: DC | PRN
  Administered 2016-03-25: 30 mg via INTRAVENOUS

## 2016-03-25 MED ORDER — BUPIVACAINE-EPINEPHRINE (PF) 0.5% -1:200000 IJ SOLN
INTRAMUSCULAR | Status: DC | PRN
  Administered 2016-03-25: 30 mL

## 2016-03-25 MED ORDER — SUCCINYLCHOLINE CHLORIDE 20 MG/ML IJ SOLN
INTRAMUSCULAR | Status: DC | PRN
  Administered 2016-03-25: 120 mg via INTRAVENOUS

## 2016-03-25 MED ORDER — OXYCODONE HCL 5 MG PO TABS
ORAL_TABLET | ORAL | Status: AC
  Filled 2016-03-25: qty 2

## 2016-03-25 MED ORDER — PROPOFOL 10 MG/ML IV BOLUS
INTRAVENOUS | Status: DC | PRN
  Administered 2016-03-25: 150 mg via INTRAVENOUS

## 2016-03-25 MED ORDER — FENTANYL CITRATE (PF) 100 MCG/2ML IJ SOLN
INTRAMUSCULAR | Status: AC
  Filled 2016-03-25: qty 2

## 2016-03-25 MED ORDER — ONDANSETRON HCL 4 MG/2ML IJ SOLN: 4.0000 mg | Freq: Once | INTRAMUSCULAR | Status: DC | PRN

## 2016-03-25 MED ORDER — NAPROXEN SODIUM 220 MG PO TABS: 440.0000 mg | ORAL_TABLET | Freq: Two times a day (BID) | ORAL | Status: DC

## 2016-03-25 MED ORDER — ACETAMINOPHEN 10 MG/ML IV SOLN
INTRAVENOUS | Status: AC
  Filled 2016-03-25: qty 100

## 2016-03-25 MED ORDER — FENTANYL CITRATE (PF) 100 MCG/2ML IJ SOLN
25.0000 ug | INTRAMUSCULAR | Status: AC | PRN
  Administered 2016-03-25 (×6): 25 ug via INTRAVENOUS

## 2016-03-25 MED ORDER — FENTANYL CITRATE (PF) 100 MCG/2ML IJ SOLN
INTRAMUSCULAR | Status: DC | PRN
  Administered 2016-03-25 (×2): 50 ug via INTRAVENOUS
  Administered 2016-03-25: 100 ug via INTRAVENOUS

## 2016-03-25 MED ORDER — ONDANSETRON HCL 4 MG/2ML IJ SOLN
INTRAMUSCULAR | Status: DC | PRN
  Administered 2016-03-25: 4 mg via INTRAVENOUS

## 2016-03-25 MED ORDER — FAMOTIDINE 20 MG PO TABS
20.0000 mg | ORAL_TABLET | Freq: Once | ORAL | Status: AC
  Administered 2016-03-25: 20 mg via ORAL

## 2016-03-25 MED ORDER — BUPIVACAINE-EPINEPHRINE (PF) 0.5% -1:200000 IJ SOLN
INTRAMUSCULAR | Status: AC
Start: 2016-03-25 — End: 2016-03-25
  Filled 2016-03-25: qty 30

## 2016-03-25 MED ORDER — ACETAMINOPHEN 500 MG PO TABS
ORAL_TABLET | ORAL | Status: AC
  Filled 2016-03-25: qty 1

## 2016-03-25 MED ORDER — LIDOCAINE HCL (CARDIAC) 20 MG/ML IV SOLN
INTRAVENOUS | Status: DC | PRN
  Administered 2016-03-25: 50 mg via INTRAVENOUS

## 2016-03-25 MED ORDER — DEXAMETHASONE SODIUM PHOSPHATE 10 MG/ML IJ SOLN
INTRAMUSCULAR | Status: DC | PRN
  Administered 2016-03-25: 10 mg via INTRAVENOUS

## 2016-03-25 MED ORDER — MIDAZOLAM HCL 2 MG/2ML IJ SOLN
INTRAMUSCULAR | Status: DC | PRN
  Administered 2016-03-25: 2 mg via INTRAVENOUS

## 2016-03-25 MED ORDER — PHENYLEPHRINE HCL 10 MG/ML IJ SOLN
INTRAMUSCULAR | Status: DC | PRN
  Administered 2016-03-25: 100 ug via INTRAVENOUS

## 2016-03-25 MED ORDER — ACETAMINOPHEN 500 MG PO TABS
500.0000 mg | ORAL_TABLET | Freq: Once | ORAL | Status: AC
  Administered 2016-03-25: 500 mg via ORAL

## 2016-03-25 MED ORDER — HYDROCODONE-ACETAMINOPHEN 5-325 MG PO TABS: 1.0000 | ORAL_TABLET | ORAL | Status: DC | PRN

## 2016-03-25 MED ORDER — ROCURONIUM BROMIDE 100 MG/10ML IV SOLN
INTRAVENOUS | Status: DC | PRN
  Administered 2016-03-25: 10 mg via INTRAVENOUS
  Administered 2016-03-25: 40 mg via INTRAVENOUS

## 2016-03-25 MED ORDER — LACTATED RINGERS IV SOLN
INTRAVENOUS | Status: DC
  Administered 2016-03-25: 1000 mL via INTRAVENOUS
  Administered 2016-03-25 (×2): via INTRAVENOUS

## 2016-03-25 MED ORDER — OXYCODONE HCL 5 MG PO TABS
10.0000 mg | ORAL_TABLET | Freq: Once | ORAL | Status: AC
Start: 2016-03-25 — End: 2016-03-25
  Administered 2016-03-25: 10 mg via ORAL

## 2016-03-25 MED ORDER — SUGAMMADEX SODIUM 200 MG/2ML IV SOLN
INTRAVENOUS | Status: DC | PRN
  Administered 2016-03-25: 161.4 mg via INTRAVENOUS

## 2016-03-25 SURGICAL SUPPLY — 35 items
BLADE SURG 15 STRL SS SAFETY (BLADE) ×3 IMPLANT
CANISTER SUCT 1200ML W/VALVE (MISCELLANEOUS) ×3 IMPLANT
CATH TRAY 16F METER LATEX (MISCELLANEOUS) IMPLANT
CHLORAPREP W/TINT 26ML (MISCELLANEOUS) ×3 IMPLANT
CLOSURE WOUND 1/2 X4 (GAUZE/BANDAGES/DRESSINGS) ×1
DRAIN CHANNEL JP 15F RND 16 (MISCELLANEOUS) IMPLANT
DRAPE CHEST BREAST 77X106 FENE (MISCELLANEOUS) IMPLANT
DRAPE LAPAROTOMY 100X77 ABD (DRAPES) ×3 IMPLANT
DRSG TEGADERM 4X4.75 (GAUZE/BANDAGES/DRESSINGS) ×3 IMPLANT
DRSG TELFA 3X8 NADH (GAUZE/BANDAGES/DRESSINGS) ×3 IMPLANT
ELECT REM PT RETURN 9FT ADLT (ELECTROSURGICAL) ×3
ELECTRODE REM PT RTRN 9FT ADLT (ELECTROSURGICAL) ×1 IMPLANT
GAUZE SPONGE 4X4 12PLY STRL (GAUZE/BANDAGES/DRESSINGS) IMPLANT
GLOVE BIO SURGEON STRL SZ7.5 (GLOVE) ×12 IMPLANT
GLOVE INDICATOR 8.0 STRL GRN (GLOVE) ×6 IMPLANT
GOWN STRL REUS W/ TWL LRG LVL3 (GOWN DISPOSABLE) ×3 IMPLANT
GOWN STRL REUS W/TWL LRG LVL3 (GOWN DISPOSABLE) ×6
KIT RM TURNOVER STRD PROC AR (KITS) ×3 IMPLANT
LABEL OR SOLS (LABEL) IMPLANT
MESH VENT ST 7.6CM S CIR (Mesh General) ×3 IMPLANT
NDL SAFETY 22GX1.5 (NEEDLE) ×3 IMPLANT
NEEDLE HYPO 25X1 1.5 SAFETY (NEEDLE) ×3 IMPLANT
NS IRRIG 500ML POUR BTL (IV SOLUTION) ×3 IMPLANT
PACK BASIN MINOR ARMC (MISCELLANEOUS) ×3 IMPLANT
SPONGE LAP 18X18 5 PK (GAUZE/BANDAGES/DRESSINGS) ×3 IMPLANT
STAPLER SKIN PROX 35W (STAPLE) IMPLANT
STRIP CLOSURE SKIN 1/2X4 (GAUZE/BANDAGES/DRESSINGS) ×2 IMPLANT
SUT SURGILON 0 BLK (SUTURE) ×3 IMPLANT
SUT VIC AB 2-0 BRD 54 (SUTURE) IMPLANT
SUT VIC AB 3-0 SH 27 (SUTURE) ×2
SUT VIC AB 3-0 SH 27X BRD (SUTURE) ×1 IMPLANT
SUT VIC AB 4-0 FS2 27 (SUTURE) ×3 IMPLANT
SUT VICRYL+ 3-0 144IN (SUTURE) ×3 IMPLANT
SYR 3ML LL SCALE MARK (SYRINGE) IMPLANT
SYR CONTROL 10ML (SYRINGE) ×3 IMPLANT

## 2016-03-25 NOTE — Transfer of Care (Signed)
Immediate Anesthesia Transfer of Care Note  Patient: Anne Castillo  Procedure(s) Performed: Procedure(s): HERNIA REPAIR VENTRAL ADULT (N/A)  Patient Location: PACU  Anesthesia Type:General  Level of Consciousness: sedated  Airway & Oxygen Therapy: Patient Spontanous Breathing and Patient connected to face mask oxygen  Post-op Assessment: Report given to RN and Post -op Vital signs reviewed and stable  Post vital signs: Reviewed and stable  Last Vitals:  Filed Vitals:   03/25/16 0901  BP: 201/77  Pulse: 64  Temp: 36.5 C  Resp: 20    Last Pain: There were no vitals filed for this visit.    Patients Stated Pain Goal: 2 ([phone])  Complications: No apparent anesthesia complications

## 2016-03-25 NOTE — H&P (Signed)
Anne Castillo [id number] 1946/01/20     HPI:  Patient for ventral hernia repair.  No change in clinical condition or exam since last visit.   Prescriptions prior to admission  Medication Sig Dispense Refill Last Dose  . amLODipine (NORVASC) 5 MG tablet Take 1 tablet (5 mg total) by mouth daily. 30 tablet 11 03/25/2016 at Unknown time  . aspirin EC 81 MG tablet Take 81 mg by mouth.   Past Week at Unknown time  . cholecalciferol (VITAMIN D) 400 units TABS tablet Take 400 Units by mouth daily.   Past Week at Unknown time  . Multiple Vitamin (MULTIVITAMIN) capsule Take 1 capsule by mouth daily.   Past Week at Unknown time  . vitamin A 7500 UNIT capsule Take 7,500 Units by mouth daily.   Past Week at Unknown time   No Known Allergies Past Medical History  Diagnosis Date  . Hypertension   . Pain     sharp epigastric pain "stress"  . Anxiety    Past Surgical History  Procedure Laterality Date  . Cholecystectomy N/A 03/29/2015    Procedure: LAPAROSCOPIC CHOLECYSTECTOMY;  Surgeon: Marlyce Huge, MD;  Location: [facility] ORS;  Service: General;  Laterality: N/A;   Social History   Social History  . Marital Status: Married    Spouse Name: N/A  . Number of Children: N/A  . Years of Education: N/A   Occupational History  . Not on file.   Social History Main Topics  . Smoking status: Never Smoker   . Smokeless tobacco: Never Used  . Alcohol Use: No  . Drug Use: No  . Sexual Activity: Not on file   Other Topics Concern  . Not on file   Social History Narrative   Social History   Social History Narrative     ROS: Negative.     PE: HEENT: Negative. Lungs: Clear. Cardio: RR. ABD: Hernia above supraumbilical incision from Hasson cannula placement.  Assessment/Plan:  Proceed with planned ventral hernia repair.   Robert Bellow 03/25/2016   Assessment/Plan:  Proceed with planned endoscopy.

## 2016-03-25 NOTE — Anesthesia Preprocedure Evaluation (Addendum)
Anesthesia Evaluation  Patient identified by MRN, date of birth, ID band Patient awake    Reviewed: Allergy & Precautions, H&P , NPO status , Patient's Chart, lab work & pertinent test results, reviewed documented beta blocker date and time   History of Anesthesia Complications Negative for: history of anesthetic complications  Airway Mallampati: III  TM Distance: >3 FB Neck ROM: full    Dental no notable dental hx. (+) Partial Upper, Chipped, Missing, Poor Dentition   Pulmonary neg pulmonary ROS,    Pulmonary exam normal breath sounds clear to auscultation       Cardiovascular Exercise Tolerance: Good hypertension, (-) angina(-) CAD, (-) Past MI, (-) Cardiac Stents and (-) CABG Normal cardiovascular exam(-) dysrhythmias (-) Valvular Problems/Murmurs Rhythm:regular Rate:Normal     Neuro/Psych PSYCHIATRIC DISORDERS (Anxiety) negative neurological ROS     GI/Hepatic negative GI ROS, Neg liver ROS,   Endo/Other  negative endocrine ROS  Renal/GU negative Renal ROS  negative genitourinary   Musculoskeletal   Abdominal   Peds  Hematology negative hematology ROS (+)   Anesthesia Other Findings Past Medical History:   Hypertension                                                 Pain                                                           Comment:sharp epigastric pain "stress"   Anxiety                                                      Reproductive/Obstetrics negative OB ROS                             Anesthesia Physical Anesthesia Plan  ASA: II  Anesthesia Plan: General   Post-op Pain Management:    Induction:   Airway Management Planned:   Additional Equipment:   Intra-op Plan:   Post-operative Plan:   Informed Consent: I have reviewed the patients History and Physical, chart, labs and discussed the procedure including the risks, benefits and alternatives for the proposed  anesthesia with the patient or authorized representative who has indicated his/her understanding and acceptance.   Dental Advisory Given  Plan Discussed with: Anesthesiologist, CRNA and Surgeon  Anesthesia Plan Comments:         Anesthesia Quick Evaluation

## 2016-03-25 NOTE — Anesthesia Postprocedure Evaluation (Signed)
Anesthesia Post Note  Patient: Anne Castillo  Procedure(s) Performed: Procedure(s) (LRB): HERNIA REPAIR VENTRAL ADULT (N/A)  Patient location during evaluation: PACU Anesthesia Type: General Level of consciousness: awake and alert Pain management: pain level not controlled Vital Signs Assessment: post-procedure vital signs reviewed and stable Respiratory status: spontaneous breathing, nonlabored ventilation, respiratory function stable and patient connected to nasal cannula oxygen Cardiovascular status: blood pressure returned to baseline and stable Postop Assessment: no signs of nausea or vomiting Anesthetic complications: no Comments: Giving patient PO pain medicine in phase 2 to help control her pain.    Last Vitals:  Filed Vitals:   03/25/16 1117 03/25/16 1135  BP: 137/63 161/74  Pulse: 78 72  Temp:  36.3 C  Resp: 11 14    Last Pain:  Filed Vitals:   03/25/16 1141  PainSc: 9                  Martha Clan

## 2016-03-25 NOTE — Op Note (Signed)
Preoperative diagnosis: Ventral hernia status post cholecystectomy.  Postoperative diagnosis: Same.  Operative procedure: Repair of ventral hernia with placement of 6.4 cm Ventrio ST mesh.  Operating surgeon Hervey Ard, M.D.  Anesthesia: Gen. endotracheal, Marcaine 0.5% with 1-200,000 epinephrine, 30 mL local infiltration.  Estimated blood loss: 5 mL.  Clinical note: This 70 year old woman had a supraumbilical placement of a Hassan cannula for cholecystectomy. She developed a ventral hernia. She was admitted for elective repair.  Operative note: With the patient under adequate general endotracheal anesthesia the abdomen was prepped with ChloraPrep and draped. The previous incision was excised and extended for about 6 cm, primarily skin in a cephalad direction. The skin was incised sharply and the remaining dissection completed with electrocautery. The hernia sac was identified and resected at the fascial level. Hemostasis of some prominent vessels were with a 3-0 Vicryl tie. The fascial defect measured approximately 3.5 cm in diameter. They said size it was elected to place a reinforcing mesh. The undersurface of the fascia was cleared circumferentially. A 6.4 cm Ventrio ST mesh was placed into the peritoneal cavity and anchored with trans-fascial sutures in 4 quadrants. The midline fascia was then approximated with interrupted 0 Surgilon figure-of-eight sutures taking a small purchase on the mesh with each passage to minimize rotation. The adipose layer was closed in 2 layers with a running 3-0 Vicryl suture. The skin was closed with a running 4-0 Vicryl subcuticular suture. Benzoin, Steri-Strips, Telfa and Tegaderm dressing was applied.  The patient tolerated the procedure well and was taken to recovery in stable condition.

## 2016-03-25 NOTE — Anesthesia Procedure Notes (Signed)
Procedure Name: Intubation Date/Time: 03/25/2016 9:50 AM Performed by: Nelda Marseille Pre-anesthesia Checklist: Patient identified, Patient being monitored, Timeout performed, Emergency Drugs available and Suction available Patient Re-evaluated:Patient Re-evaluated prior to inductionOxygen Delivery Method: Circle system utilized Preoxygenation: Pre-oxygenation with 100% oxygen Intubation Type: IV induction Ventilation: Mask ventilation without difficulty Laryngoscope Size: Mac and 3 Grade View: Grade III Tube type: Oral Tube size: 7.0 mm Number of attempts: 1 Airway Equipment and Method: Stylet Placement Confirmation: ETT inserted through vocal cords under direct vision,  positive ETCO2 and breath sounds checked- equal and bilateral Secured at: 21 cm Tube secured with: Tape Dental Injury: Teeth and Oropharynx as per pre-operative assessment

## 2016-03-25 NOTE — Discharge Instructions (Signed)

## 2016-04-03 ENCOUNTER — Ambulatory Visit (INDEPENDENT_AMBULATORY_CARE_PROVIDER_SITE_OTHER): Payer: Medicare Other | Admitting: General Surgery

## 2016-04-03 VITALS — BP 130/72 | HR 80 | Resp 14 | Ht 60.0 in | Wt 180.0 lb

## 2016-04-03 DIAGNOSIS — K439 Ventral hernia without obstruction or gangrene: Secondary | ICD-10-CM

## 2016-04-03 NOTE — Patient Instructions (Addendum)
Patient to return in three months. Proper lifting techniques reviewed.

## 2016-04-03 NOTE — Progress Notes (Signed)
Patient ID: Anne Castillo, female   DOB: 1946-08-11, 70 y.o.   MRN: [id number]  Chief Complaint  Patient presents with  . Routine Post Op    ventral hernia    HPI Anne Castillo is a 70 y.o. female here today for her post op ventral hernia repair done on 03/25/16. Patient states she is doing well. Moving bowels daily.Interpreter Chip Boer is present.  I personally reviewed the patient's history. HPI  Past Medical History  Diagnosis Date  . Hypertension   . Pain     sharp epigastric pain "stress"  . Anxiety     Past Surgical History  Procedure Laterality Date  . Cholecystectomy N/A 03/29/2015    Procedure: LAPAROSCOPIC CHOLECYSTECTOMY;  Surgeon: Marlyce Huge, MD;  Location: [facility];  Service: General;  Laterality: N/A;  . Ventral hernia repair N/A 03/25/2016    Procedure: HERNIA REPAIR VENTRAL ADULT;  Surgeon: Robert Bellow, MD;  Location: [facility];  Service: General;  Laterality: N/A;  . Hernia repair  03/25/2016    Ventral hernia repair with 6.4 cm Ventrio ST mesh    Family History  Problem Relation Age of Onset  . Kidney cancer Mother     Social History Social History  Substance Use Topics  . Smoking status: Never Smoker   . Smokeless tobacco: Never Used  . Alcohol Use: No    No Known Allergies  Current Outpatient Prescriptions  Medication Sig Dispense Refill  . amLODipine (NORVASC) 5 MG tablet Take 1 tablet (5 mg total) by mouth daily. 30 tablet 11  . aspirin EC 81 MG tablet Take 81 mg by mouth.    . cholecalciferol (VITAMIN D) 400 units TABS tablet Take 400 Units by mouth daily.    Marland Kitchen HYDROcodone-acetaminophen (NORCO) 5-325 MG tablet Take 1-2 tablets by mouth every 4 (four) hours as needed for moderate pain. 30 tablet 0  . Multiple Vitamin (MULTIVITAMIN) capsule Take 1 capsule by mouth daily.    . naproxen sodium (ALEVE) 220 MG tablet Take 2 tablets (440 mg total) by mouth 2 (two) times daily with a meal. 100 tablet 0  . vitamin A 7500 UNIT capsule  Take 7,500 Units by mouth daily.     No current facility-administered medications for this visit.    Review of Systems Review of Systems  Constitutional: Negative.   Respiratory: Negative.   Cardiovascular: Negative.     Blood pressure 130/72, pulse 80, resp. rate 14, height 5' (1.524 m), weight 180 lb (81.647 kg).  Physical Exam Physical Exam  Constitutional: She is oriented to person, place, and time. She appears well-developed and well-nourished.  Cardiovascular: Normal rate, regular rhythm and normal heart sounds.   Pulmonary/Chest: Effort normal and breath sounds normal.  Abdominal: Soft. Normal appearance and bowel sounds are normal. There is no tenderness.  Ventral hernia repair is intact and healing well.   Neurological: She is alert and oriented to person, place, and time.  Skin: Skin is warm and dry.       Assessment    Doing well status post ventral hernia repair.    Plan    Proper lifting technique reviewed. She will increase her activity as tolerated.   Patient to return in three months. PCP:  [place] This information has been scribed by Gaspar Cola CMA.    Robert Bellow 04/05/2016, 4:22 PM

## 2016-04-05 ENCOUNTER — Encounter: Payer: Self-pay | Admitting: General Surgery

## 2016-05-07 ENCOUNTER — Encounter: Payer: Self-pay | Admitting: General Surgery

## 2016-07-02 ENCOUNTER — Ambulatory Visit: Payer: Medicare Other | Admitting: General Surgery

## 2016-07-18 ENCOUNTER — Other Ambulatory Visit: Payer: Self-pay | Admitting: Cardiology

## 2016-07-22 ENCOUNTER — Ambulatory Visit: Payer: Medicare Other | Admitting: General Surgery

## 2016-10-03 ENCOUNTER — Encounter: Payer: Self-pay | Admitting: *Deleted

## 2017-02-26 ENCOUNTER — Encounter: Payer: Self-pay | Admitting: *Deleted

## 2017-02-27 NOTE — Discharge Instructions (Signed)

## 2017-02-28 ENCOUNTER — Ambulatory Visit: Payer: Medicare Other | Admitting: Anesthesiology

## 2017-02-28 ENCOUNTER — Encounter: Payer: Self-pay | Admitting: Otolaryngology

## 2017-02-28 ENCOUNTER — Encounter
Admission: RE | Disposition: A | Discharge status: Home / Self Care | Payer: Self-pay | Source: Ambulatory Visit | Attending: Otolaryngology

## 2017-02-28 ENCOUNTER — Ambulatory Visit
Admission: RE | Admit: 2017-02-28 | Discharge: 2017-02-28 | Disposition: A | Discharge status: Home / Self Care | Payer: Medicare Other | Source: Ambulatory Visit | Attending: Otolaryngology | Admitting: Otolaryngology

## 2017-02-28 DIAGNOSIS — I10 Essential (primary) hypertension: Secondary | ICD-10-CM | POA: Diagnosis not present

## 2017-02-28 DIAGNOSIS — Z79899 Other long term (current) drug therapy: Secondary | ICD-10-CM | POA: Diagnosis not present

## 2017-02-28 DIAGNOSIS — D1779 Benign lipomatous neoplasm of other sites: Secondary | ICD-10-CM | POA: Diagnosis not present

## 2017-02-28 DIAGNOSIS — K089 Disorder of teeth and supporting structures, unspecified: Secondary | ICD-10-CM | POA: Insufficient documentation

## 2017-02-28 HISTORY — PX: MASS EXCISION: SHX2000

## 2017-02-28 HISTORY — DX: Presence of dental prosthetic device (complete) (partial): Z97.2

## 2017-02-28 SURGERY — EXCISION MASS
Anesthesia: General | Laterality: Right | Wound class: Clean Contaminated

## 2017-02-28 MED ORDER — LIDOCAINE HCL (PF) 1 % IJ SOLN: INTRAMUSCULAR | Status: DC | PRN

## 2017-02-28 MED ORDER — LIDOCAINE HCL 4 % MT SOLN
OROMUCOSAL | Status: DC | PRN
  Administered 2017-02-28: 4 mL via TOPICAL

## 2017-02-28 MED ORDER — PROPOFOL 10 MG/ML IV BOLUS
INTRAVENOUS | Status: DC | PRN
  Administered 2017-02-28: 200 mg via INTRAVENOUS

## 2017-02-28 MED ORDER — LACTATED RINGERS IV SOLN
INTRAVENOUS | Status: DC
  Administered 2017-02-28: 09:00:00 via INTRAVENOUS

## 2017-02-28 MED ORDER — FENTANYL CITRATE (PF) 100 MCG/2ML IJ SOLN
25.0000 ug | INTRAMUSCULAR | Status: DC | PRN
Start: 2017-02-28 — End: 2017-02-28
  Administered 2017-02-28 (×2): 25 ug via INTRAVENOUS

## 2017-02-28 MED ORDER — DEXAMETHASONE SODIUM PHOSPHATE 4 MG/ML IJ SOLN
INTRAMUSCULAR | Status: DC | PRN
  Administered 2017-02-28: 4 mg via INTRAVENOUS

## 2017-02-28 MED ORDER — MIDAZOLAM HCL 5 MG/5ML IJ SOLN
INTRAMUSCULAR | Status: DC | PRN
  Administered 2017-02-28: 2 mg via INTRAVENOUS

## 2017-02-28 MED ORDER — OXYCODONE HCL 5 MG PO TABS
5.0000 mg | ORAL_TABLET | Freq: Once | ORAL | Status: AC | PRN
  Administered 2017-02-28: 5 mg via ORAL

## 2017-02-28 MED ORDER — FENTANYL CITRATE (PF) 100 MCG/2ML IJ SOLN
INTRAMUSCULAR | Status: DC | PRN
  Administered 2017-02-28: 100 ug via INTRAVENOUS

## 2017-02-28 MED ORDER — LIDOCAINE HCL (CARDIAC) 20 MG/ML IV SOLN
INTRAVENOUS | Status: DC | PRN
Start: 2017-02-28 — End: 2017-02-28
  Administered 2017-02-28: 50 mg via INTRAVENOUS

## 2017-02-28 MED ORDER — ONDANSETRON HCL 4 MG/2ML IJ SOLN
INTRAMUSCULAR | Status: DC | PRN
  Administered 2017-02-28: 4 mg via INTRAVENOUS

## 2017-02-28 MED ORDER — LIDOCAINE-EPINEPHRINE (PF) 1 %-1:200000 IJ SOLN
INTRAMUSCULAR | Status: DC | PRN
  Administered 2017-02-28: 1 mL

## 2017-02-28 MED ORDER — OXYCODONE HCL 5 MG/5ML PO SOLN: 5.0000 mg | Freq: Once | ORAL | Status: AC | PRN

## 2017-02-28 SURGICAL SUPPLY — 15 items
BLADE SURG 15 STRL LF DISP TIS (BLADE) IMPLANT
BLADE SURG 15 STRL SS (BLADE)
CORD BIP STRL DISP 12FT (MISCELLANEOUS) IMPLANT
DRAPE HEAD BAR (DRAPES) ×2 IMPLANT
GLOVE PI ULTRA LF STRL 7.5 (GLOVE) ×2 IMPLANT
GLOVE PI ULTRA NON LATEX 7.5 (GLOVE) ×2
KIT ROOM TURNOVER OR (KITS) ×2 IMPLANT
NEEDLE HYPO 25GX1X1/2 BEV (NEEDLE) ×2 IMPLANT
NS IRRIG 500ML POUR BTL (IV SOLUTION) ×2 IMPLANT
PACK DRAPE NASAL/ENT (PACKS) ×2 IMPLANT
PAD GROUND ADULT SPLIT (MISCELLANEOUS) ×2 IMPLANT
PENCIL ELECTRO HAND CTR (MISCELLANEOUS) ×2 IMPLANT
STRAP BODY AND KNEE 60X3 (MISCELLANEOUS) ×4 IMPLANT
SUT CHROMIC 3 0 SH 27 (SUTURE) ×2 IMPLANT
SYRINGE 10CC LL (SYRINGE) ×2 IMPLANT

## 2017-02-28 NOTE — Anesthesia Preprocedure Evaluation (Signed)
Anesthesia Evaluation  Patient identified by MRN, date of birth, ID band Patient awake    Reviewed: Allergy & Precautions, H&P , NPO status , Patient's Chart, lab work & pertinent test results  Airway Mallampati: II  TM Distance: >3 FB Neck ROM: full    Dental no notable dental hx.    Pulmonary neg pulmonary ROS,    Pulmonary exam normal        Cardiovascular hypertension, On Medications Normal cardiovascular exam     Neuro/Psych    GI/Hepatic negative GI ROS, Neg liver ROS,   Endo/Other  negative endocrine ROS  Renal/GU negative Renal ROS     Musculoskeletal   Abdominal   Peds  Hematology negative hematology ROS (+)   Anesthesia Other Findings   Reproductive/Obstetrics negative OB ROS                             Anesthesia Physical Anesthesia Plan  ASA: II  Anesthesia Plan: General ETT   Post-op Pain Management:    Induction:   Airway Management Planned:   Additional Equipment:   Intra-op Plan:   Post-operative Plan:   Informed Consent: I have reviewed the patients History and Physical, chart, labs and discussed the procedure including the risks, benefits and alternatives for the proposed anesthesia with the patient or authorized representative who has indicated his/her understanding and acceptance.     Plan Discussed with:   Anesthesia Plan Comments:         Anesthesia Quick Evaluation

## 2017-02-28 NOTE — Op Note (Signed)
02/28/2017  10:35 AM    Idelle Crouch  [id number]   Pre-Op Dx:  Right posterior gum lesion  Post-op Dx: Same  Proc: Excision right posterior gum lesion with primary closure   Surg:  Ciela Mahajan H  Anes:  GOT  EBL:  Minimal  Comp:  None  Findings:  The lesion was 2 cm in height and roundish in nature with a broad sessile base approximately 2 cm in diameter. It appeared to have a fatty substance inside and may be a lipoma. It was coming off the medial superior surfaces of the right posterior jaw.  Procedure: The patient was given general anesthesia by oral endotracheal intubation. A Davis mouth gag was used to open the mouth and hold the tongue back to visualize the oropharynx. You could see the mass was 2 cm in height and roundish. It had a broad sessile base that was attached at the right posterior go home, just in front of the angle of the jaw. It involved the right side of the jaw bone and the superior surface. The mucosa is very healthy over the top of it. No other oral lesions noted.  1 mL of 1% Xylocaine with epi 1-200,000 was used for infiltration of the base of the mass. This was allowed to sit for a few minutes for vasoconstriction. Electrocautery was then used to cut around the base of the mass and separated from the underlying jawbone. He can see fatty tissue that was then this and was pulled up and removed. There is no significant bleeding. The periosteum over the mandible was not involved and was left intact. The gum was infolded medially and the wound was then closed using interrupted 3-0 chromic sutures with the knot buried in the wound. This pulled together the wound and closed the hole completely. Blood loss was minimal.  The patient tolerated the procedure well. She was awakened and taken to the recovery room in satisfactory condition. There were no operative complications.  Dispo:   To PACU to be discharged home  Plan:  To follow-up in the office in 1 week for  wound evaluation and go over the path report.  Carola Viramontes H  02/28/2017 10:35 AM

## 2017-02-28 NOTE — Transfer of Care (Signed)
Immediate Anesthesia Transfer of Care Note  Patient: Anne Castillo  Procedure(s) Performed: Procedure(s): EXCISION oral lesion (Right)  Patient Location: PACU  Anesthesia Type: General ETT  Level of Consciousness: awake, alert  and patient cooperative  Airway and Oxygen Therapy: Patient Spontanous Breathing and Patient connected to supplemental oxygen  Post-op Assessment: Post-op Vital signs reviewed, Patient's Cardiovascular Status Stable, Respiratory Function Stable, Patent Airway and No signs of Nausea or vomiting  Post-op Vital Signs: Reviewed and stable  Complications: No apparent anesthesia complications

## 2017-02-28 NOTE — Anesthesia Procedure Notes (Signed)
Procedure Name: Intubation Date/Time: 02/28/2017 10:13 AM Performed by: Londell Moh Pre-anesthesia Checklist: Patient identified, Emergency Drugs available, Suction available, Patient being monitored and Timeout performed Patient Re-evaluated:Patient Re-evaluated prior to inductionOxygen Delivery Method: Circle system utilized Preoxygenation: Pre-oxygenation with 100% oxygen Intubation Type: IV induction Ventilation: Mask ventilation without difficulty Laryngoscope Size: Mac and 3 Grade View: Grade II Tube type: Oral Rae Tube size: 7.0 mm Number of attempts: 1 Airway Equipment and Method: LTA kit utilized Placement Confirmation: ETT inserted through vocal cords under direct vision,  positive ETCO2 and breath sounds checked- equal and bilateral Tube secured with: Tape Dental Injury: Teeth and Oropharynx as per pre-operative assessment

## 2017-02-28 NOTE — Anesthesia Postprocedure Evaluation (Signed)
Anesthesia Post Note  Patient: Anne Castillo  Procedure(s) Performed: Procedure(s) (LRB): EXCISION oral lesion (Right)  Patient location during evaluation: PACU Anesthesia Type: General Level of consciousness: awake and alert Pain management: pain level controlled Vital Signs Assessment: post-procedure vital signs reviewed and stable Respiratory status: spontaneous breathing Cardiovascular status: blood pressure returned to baseline Postop Assessment: no headache Anesthetic complications: no    Jaci Standard, III,  Kinesha Auten D

## 2017-03-04 ENCOUNTER — Encounter: Payer: Self-pay | Admitting: Otolaryngology

## 2017-03-05 LAB — SURGICAL PATHOLOGY

## 2018-08-03 ENCOUNTER — Other Ambulatory Visit: Payer: Self-pay

## 2018-08-03 ENCOUNTER — Encounter: Payer: Self-pay | Admitting: Emergency Medicine

## 2018-08-03 ENCOUNTER — Ambulatory Visit
Admission: EM | Admit: 2018-08-03 | Discharge: 2018-08-03 | Disposition: A | Discharge status: Home / Self Care | Payer: Medicare Other | Attending: Family Medicine | Admitting: Family Medicine

## 2018-08-03 DIAGNOSIS — M5440 Lumbago with sciatica, unspecified side: Secondary | ICD-10-CM | POA: Diagnosis not present

## 2018-08-03 DIAGNOSIS — N390 Urinary tract infection, site not specified: Secondary | ICD-10-CM

## 2018-08-03 DIAGNOSIS — M5431 Sciatica, right side: Secondary | ICD-10-CM | POA: Diagnosis not present

## 2018-08-03 DIAGNOSIS — G8929 Other chronic pain: Secondary | ICD-10-CM

## 2018-08-03 LAB — URINALYSIS, COMPLETE (UACMP) WITH MICROSCOPIC
Bilirubin Urine: NEGATIVE
GLUCOSE, UA: NEGATIVE mg/dL
HGB URINE DIPSTICK: NEGATIVE
KETONES UR: NEGATIVE mg/dL
Nitrite: NEGATIVE
Protein, ur: NEGATIVE mg/dL
Specific Gravity, Urine: 1.015 (ref 1.005–1.030)
pH: 7 (ref 5.0–8.0)

## 2018-08-03 MED ORDER — CYCLOBENZAPRINE HCL 5 MG PO TABS: 5.0000 mg | ORAL_TABLET | Freq: Every day | ORAL | 0 refills | Status: DC

## 2018-08-03 MED ORDER — PREDNISONE 20 MG PO TABS: 20.0000 mg | ORAL_TABLET | Freq: Every day | ORAL | 0 refills | Status: DC

## 2018-08-03 MED ORDER — CEPHALEXIN 500 MG PO CAPS: 500.0000 mg | ORAL_CAPSULE | Freq: Two times a day (BID) | ORAL | 0 refills | Status: DC

## 2018-08-03 NOTE — ED Provider Notes (Signed)
[facility]    CSN: [id number] Arrival date & time: 08/03/18  1755     History   Chief Complaint Chief Complaint  Patient presents with  . Back Pain    HPI Anne Castillo is a 72 y.o. female.   72 yo female with a c/o low back pain with intermittent radiation of pain down the back of the right leg. States she has a h/o low back problems (degenerative disc disease). Patient also has some discomfort on her right flank area and reports concentrated urine. Denies any fevers, chills, vomiting.   The history is provided by the patient.  Back Pain    Past Medical History:  Diagnosis Date  . Anxiety   . Hypertension   . Pain    sharp epigastric pain "stress"  . Wears dentures    partial upper    Patient Active Problem List   Diagnosis Date Noted  . Ventral hernia without obstruction or gangrene 02/23/2016  . Essential hypertension, benign 01/04/2016  . Pain in the chest 01/04/2016    Past Surgical History:  Procedure Laterality Date  . CHOLECYSTECTOMY N/A 03/29/2015   Procedure: LAPAROSCOPIC CHOLECYSTECTOMY;  Surgeon: Marlyce Huge, MD;  Location: [facility];  Service: General;  Laterality: N/A;  . HERNIA REPAIR  03/25/2016   Ventral hernia repair with 6.4 cm Ventrio ST mesh  . MASS EXCISION Right 02/28/2017   Procedure: EXCISION oral lesion;  Surgeon: Margaretha Sheffield, MD;  Location: [place];  Service: ENT;  Laterality: Right;  . VENTRAL HERNIA REPAIR N/A 03/25/2016   Procedure: HERNIA REPAIR VENTRAL ADULT;  Surgeon: Robert Bellow, MD;  Location: [facility];  Service: General;  Laterality: N/A;    OB History    Gravida  3   Para      Term      Preterm      AB      Living  3     SAB      TAB      Ectopic      Multiple      Live Births           Obstetric Comments  Menstrual age: 17  Age 1st Pregnancy: 35         Home Medications    Prior to Admission medications   Medication Sig Start Date End Date Taking?  Authorizing Provider  cholecalciferol (VITAMIN D) 400 units TABS tablet Take 400 Units by mouth daily.   Yes [provider]  losartan (COZAAR) 50 MG tablet Take 50 mg by mouth daily.   Yes [provider]  cephALEXin (KEFLEX) 500 MG capsule Take 1 capsule (500 mg total) by mouth 2 (two) times daily. 08/03/18   Norval Gable, MD  Cyanocobalamin (VITAMIN B-12 PO) Take by mouth daily.    [provider]  cyclobenzaprine (FLEXERIL) 5 MG tablet Take 1 tablet (5 mg total) by mouth at bedtime. 08/03/18   Norval Gable, MD  predniSONE (DELTASONE) 20 MG tablet Take 1 tablet (20 mg total) by mouth daily. 08/03/18   Norval Gable, MD  VITAMIN E PO Take 100 Units by mouth daily.    [provider]    Family History Family History  Problem Relation Age of Onset  . Kidney cancer Mother     Social History Social History   Tobacco Use  . Smoking status: Never Smoker  . Smokeless tobacco: Never Used  Substance Use Topics  . Alcohol use: No  .  Drug use: No     Allergies   Patient has no known allergies.   Review of Systems Review of Systems  Musculoskeletal: Positive for back pain.     Physical Exam Triage Vital Signs ED Triage Vitals  Enc Vitals Group     BP 08/03/18 1835 (!) 155/67     Pulse Rate 08/03/18 1835 (!) 53     Resp 08/03/18 1835 18     Temp 08/03/18 1835 98.4 F (36.9 C)     Temp Source 08/03/18 1835 Oral     SpO2 08/03/18 1835 100 %     Weight 08/03/18 1833 178 lb (80.7 kg)     Height 08/03/18 1833 5' (1.524 m)     Head Circumference --      Peak Flow --      Pain Score 08/03/18 1833 8     Pain Loc --      Pain Edu? --      Excl. in [place]? --    No data found.  Updated Vital Signs BP (!) 155/67 (BP Location: Left Arm)   Pulse (!) 53   Temp 98.4 F (36.9 C) (Oral)   Resp 18   Ht 5' (1.524 m)   Wt 80.7 kg   SpO2 100%   BMI 34.76 kg/m   Visual Acuity Right Eye Distance:   Left Eye Distance:   Bilateral Distance:     Right Eye Near:   Left Eye Near:    Bilateral Near:     Physical Exam  Constitutional: She appears well-developed and well-nourished. No distress.  Abdominal: Soft. Bowel sounds are normal. She exhibits no distension and no mass. There is no tenderness. There is no rebound and no guarding.  Musculoskeletal:       Lumbar back: She exhibits tenderness and spasm. She exhibits normal range of motion, no bony tenderness, no swelling, no edema, no deformity, no laceration, no pain and normal pulse.  Skin: She is not diaphoretic.  Nursing note and vitals reviewed.    UC Treatments / Results  Labs (all labs ordered are listed, but only abnormal results are displayed) Labs Reviewed  URINALYSIS, COMPLETE (UACMP) WITH MICROSCOPIC - Abnormal; Notable for the following components:      Result Value   APPearance HAZY (*)    Leukocytes, UA TRACE (*)    Bacteria, UA FEW (*)    All other components within normal limits  URINE CULTURE    EKG None  Radiology No results found.  Procedures Procedures (including critical care time)  Medications Ordered in UC Medications - No data to display  Initial Impression / Assessment and Plan / UC Course  I have reviewed the triage vital signs and the nursing notes.  Pertinent labs & imaging results that were available during my care of the patient were reviewed by me and considered in my medical decision making (see chart for details).      Final Clinical Impressions(s) / UC Diagnoses   Final diagnoses:  Sciatica, right side  Chronic low back pain with sciatica, sciatica laterality unspecified, unspecified back pain laterality  Urinary tract infection without hematuria, site unspecified     Discharge Instructions     Heat to area Extra strength tylenol     ED Prescriptions    Medication Sig Dispense Auth. Provider   cyclobenzaprine (FLEXERIL) 5 MG tablet Take 1 tablet (5 mg total) by mouth at bedtime. 30 tablet Norval Gable, MD    predniSONE (DELTASONE) 20 MG  tablet Take 1 tablet (20 mg total) by mouth daily. 7 tablet Emrey Thornley, Linward Foster, MD   cephALEXin (KEFLEX) 500 MG capsule Take 1 capsule (500 mg total) by mouth 2 (two) times daily. 6 capsule Norval Gable, MD     1. Lab results and diagnosis reviewed with patient 2. rx as per orders above; reviewed possible side effects, interactions, risks and benefits  3. Recommend supportive treatment with as above; increase fluids 4. Follow-up prn if symptoms worsen or don't improve   Controlled Substance Prescriptions [place] Controlled Substance Registry consulted? Not Applicable   Norval Gable, MD 08/03/18 2016

## 2018-08-03 NOTE — Discharge Instructions (Signed)
Heat to area Extra strength tylenol

## 2018-08-03 NOTE — ED Triage Notes (Signed)
Patient c/o low back pain that started yesterday. Patient stated she took Tylenol yesterday with some relief and this morning she woke up and the pain had returned. Denies injury.

## 2018-08-05 LAB — URINE CULTURE: SPECIAL REQUESTS: NORMAL

## 2018-08-13 ENCOUNTER — Other Ambulatory Visit: Payer: Self-pay

## 2018-08-13 ENCOUNTER — Encounter: Payer: Self-pay | Admitting: Emergency Medicine

## 2018-08-13 ENCOUNTER — Ambulatory Visit
Admission: EM | Admit: 2018-08-13 | Discharge: 2018-08-13 | Disposition: A | Discharge status: Home / Self Care | Payer: Medicare Other | Attending: Family Medicine | Admitting: Family Medicine

## 2018-08-13 DIAGNOSIS — R300 Dysuria: Secondary | ICD-10-CM | POA: Diagnosis not present

## 2018-08-13 LAB — URINALYSIS, COMPLETE (UACMP) WITH MICROSCOPIC
Bilirubin Urine: NEGATIVE
GLUCOSE, UA: NEGATIVE mg/dL
HGB URINE DIPSTICK: NEGATIVE
Nitrite: NEGATIVE
PH: 7 (ref 5.0–8.0)
Protein, ur: 30 mg/dL — AB
RBC / HPF: NONE SEEN RBC/hpf (ref 0–5)
Specific Gravity, Urine: 1.02 (ref 1.005–1.030)

## 2018-08-13 MED ORDER — CEPHALEXIN 500 MG PO CAPS: 500.0000 mg | ORAL_CAPSULE | Freq: Two times a day (BID) | ORAL | 0 refills | Status: DC

## 2018-08-13 NOTE — ED Provider Notes (Signed)
[facility]    CSN: [id number] Arrival date & time: 08/13/18  1802  History   Chief Complaint Chief Complaint  Patient presents with  . Dysuria   HPI  72 year old female presents with dysuria.  Patient reports that Anne Castillo developed dysuria this morning.  No reports of urinary urgency or frequency.  Recently treated with Keflex.  Culture with multiple bacteria.  No fever.  No chills.  No abdominal pain.  No medications or interventions tried.  No other associated symptoms.  No other complaints.  PMH, Surgical Hx, Family Hx, Social History reviewed and updated as below.  Past Medical History:  Diagnosis Date  . Anxiety   . Hypertension   . Pain    sharp epigastric pain "stress"  . Wears dentures    partial upper   Patient Active Problem List   Diagnosis Date Noted  . Ventral hernia without obstruction or gangrene 02/23/2016  . Essential hypertension, benign 01/04/2016  . Pain in the chest 01/04/2016   Past Surgical History:  Procedure Laterality Date  . CHOLECYSTECTOMY N/A 03/29/2015   Procedure: LAPAROSCOPIC CHOLECYSTECTOMY;  Surgeon: Marlyce Huge, MD;  Location: [facility];  Service: General;  Laterality: N/A;  . HERNIA REPAIR  03/25/2016   Ventral hernia repair with 6.4 cm Ventrio ST mesh  . MASS EXCISION Right 02/28/2017   Procedure: EXCISION oral lesion;  Surgeon: Margaretha Sheffield, MD;  Location: [place];  Service: ENT;  Laterality: Right;  . VENTRAL HERNIA REPAIR N/A 03/25/2016   Procedure: HERNIA REPAIR VENTRAL ADULT;  Surgeon: Robert Bellow, MD;  Location: [facility];  Service: General;  Laterality: N/A;    OB History    Gravida  3   Para      Term      Preterm      AB      Living  3     SAB      TAB      Ectopic      Multiple      Live Births           Obstetric Comments  Menstrual age: 34  Age 1st Pregnancy: 80        Home Medications    Prior to Admission medications   Medication Sig Start Date End  Date Taking? Authorizing Provider  amLODipine (NORVASC) 5 MG tablet amlodipine 5 mg tablet 01/04/16  Yes [provider]  cholecalciferol (VITAMIN D) 400 units TABS tablet Take 400 Units by mouth daily.   Yes [provider]  hydrochlorothiazide (HYDRODIURIL) 25 MG tablet hydrochlorothiazide 25 mg tablet 06/04/15  Yes [provider]  losartan (COZAAR) 50 MG tablet Take 50 mg by mouth daily.   Yes [provider]  cephALEXin (KEFLEX) 500 MG capsule Take 1 capsule (500 mg total) by mouth 2 (two) times daily. 08/13/18   Coral Spikes, DO    Family History Family History  Problem Relation Age of Onset  . Kidney cancer Mother     Social History Social History   Tobacco Use  . Smoking status: Never Smoker  . Smokeless tobacco: Never Used  Substance Use Topics  . Alcohol use: No  . Drug use: No     Allergies   Patient has no known allergies.   Review of Systems Review of Systems  Constitutional: Negative.   Genitourinary: Positive for dysuria.   Physical Exam Triage Vital Signs ED Triage Vitals  Enc Vitals Group     BP  08/13/18 1817 133/69     Pulse Rate 08/13/18 1817 78     Resp 08/13/18 1817 18     Temp 08/13/18 1817 98.3 F (36.8 C)     Temp Source 08/13/18 1817 Oral     SpO2 08/13/18 1817 99 %     Weight 08/13/18 1814 178 lb (80.7 kg)     Height 08/13/18 1814 5' (1.524 m)     Head Circumference --      Peak Flow --      Pain Score 08/13/18 1814 7     Pain Loc --      Pain Edu? --      Excl. in [place]? --    Updated Vital Signs BP 133/69 (BP Location: Left Arm)   Pulse 78   Temp 98.3 F (36.8 C) (Oral)   Resp 18   Ht 5' (1.524 m)   Wt 80.7 kg   SpO2 99%   BMI 34.76 kg/m   Visual Acuity Right Eye Distance:   Left Eye Distance:   Bilateral Distance:    Right Eye Near:   Left Eye Near:    Bilateral Near:     Physical Exam  Constitutional: Anne Castillo is oriented to person, place, and time. Anne Castillo appears well-developed. Anne Castillo  appears distressed.  HENT:  Head: Normocephalic and atraumatic.  Cardiovascular: Normal rate and regular rhythm.  Pulmonary/Chest: Effort normal and breath sounds normal. Anne Castillo has no wheezes. Anne Castillo has no rales.  Abdominal: Soft. Anne Castillo exhibits no distension. There is no tenderness.  Neurological: Anne Castillo is alert and oriented to person, place, and time.  Psychiatric: Anne Castillo has a normal mood and affect. Her behavior is normal.  Nursing note and vitals reviewed.  UC Treatments / Results  Labs (all labs ordered are listed, but only abnormal results are displayed) Labs Reviewed  URINALYSIS, COMPLETE (UACMP) WITH MICROSCOPIC - Abnormal; Notable for the following components:      Result Value   APPearance CLOUDY (*)    Ketones, ur TRACE (*)    Protein, ur 30 (*)    Leukocytes, UA SMALL (*)    Bacteria, UA MANY (*)    All other components within normal limits  URINE CULTURE    EKG None  Radiology No results found.  Procedures Procedures (including critical care time)  Medications Ordered in UC Medications - No data to display  Initial Impression / Assessment and Plan / UC Course  I have reviewed the triage vital signs and the nursing notes.  Pertinent labs & imaging results that were available during my care of the patient were reviewed by me and considered in my medical decision making (see chart for details).    72 year old female presents with dysuria.  21-50 white blood cells noted.  Many bacteria.  Hard to tell at this point time whether Anne Castillo has UTI or not.  Sending culture.  Placing on Keflex while awaiting culture.  Final Clinical Impressions(s) / UC Diagnoses   Final diagnoses:  Dysuria     Discharge Instructions     Antibiotic while awaiting culture.  Take care  Dr. Lacinda Axon    ED Prescriptions    Medication Sig Dispense Auth. Provider   cephALEXin (KEFLEX) 500 MG capsule Take 1 capsule (500 mg total) by mouth 2 (two) times daily. 10 capsule Coral Spikes, DO      Controlled Substance Prescriptions [place] Controlled Substance Registry consulted? Not Applicable   Coral Spikes, DO 08/13/18 1944

## 2018-08-13 NOTE — Discharge Instructions (Signed)
Antibiotic while awaiting culture.  Take care  Dr. Lacinda Axon

## 2018-08-13 NOTE — ED Triage Notes (Signed)
Patient stated she woke up this morning with dysuria. Patient states she was seen 10 days ago and given an antibiotic for 3 days for a UTI. She states her symptoms did improve but returned this morning.

## 2018-08-17 LAB — URINE CULTURE

## 2018-08-24 ENCOUNTER — Other Ambulatory Visit: Payer: Self-pay

## 2018-08-24 ENCOUNTER — Ambulatory Visit
Admission: EM | Admit: 2018-08-24 | Discharge: 2018-08-24 | Disposition: A | Discharge status: Home / Self Care | Payer: Medicare Other | Attending: Emergency Medicine | Admitting: Emergency Medicine

## 2018-08-24 DIAGNOSIS — R300 Dysuria: Secondary | ICD-10-CM

## 2018-08-24 DIAGNOSIS — N390 Urinary tract infection, site not specified: Secondary | ICD-10-CM | POA: Diagnosis not present

## 2018-08-24 LAB — WET PREP, GENITAL
Clue Cells Wet Prep HPF POC: NONE SEEN
Sperm: NONE SEEN
Trich, Wet Prep: NONE SEEN
Yeast Wet Prep HPF POC: NONE SEEN

## 2018-08-24 LAB — URINALYSIS, COMPLETE (UACMP) WITH MICROSCOPIC
Bilirubin Urine: NEGATIVE
GLUCOSE, UA: NEGATIVE mg/dL
HGB URINE DIPSTICK: NEGATIVE
KETONES UR: NEGATIVE mg/dL
NITRITE: NEGATIVE
PROTEIN: NEGATIVE mg/dL
Specific Gravity, Urine: 1.02 (ref 1.005–1.030)
pH: 6.5 (ref 5.0–8.0)

## 2018-08-24 MED ORDER — SULFAMETHOXAZOLE-TRIMETHOPRIM 800-160 MG PO TABS: 1.0000 | ORAL_TABLET | Freq: Two times a day (BID) | ORAL | 0 refills | Status: DC

## 2018-08-24 MED ORDER — PHENAZOPYRIDINE HCL 200 MG PO TABS: 200.0000 mg | ORAL_TABLET | Freq: Three times a day (TID) | ORAL | 0 refills | Status: DC | PRN

## 2018-08-24 MED ORDER — SULFAMETHOXAZOLE-TRIMETHOPRIM 800-160 MG PO TABS: 1.0000 | ORAL_TABLET | Freq: Two times a day (BID) | ORAL | 0 refills | Status: AC

## 2018-08-24 NOTE — ED Triage Notes (Signed)
Patient states that she is still having dysuria. Patient was seen on 08/03/2018 and 08/13/2018. States that she finished antibiotics but has not had relief from symptoms.

## 2018-08-24 NOTE — ED Provider Notes (Signed)
HPI  SUBJECTIVE:  Anne Castillo is a 72 y.o. female who presents with dysuria starting yesterday.  She states that she has had "UTIs for a month".  She denies urinary urgency, frequency, cloudy or odorous urine, hematuria.  No fevers, back, abdominal, pelvic pain.  No vaginal itching, bleeding, odor, discharge, labial swelling.  She is sexually active with her husband of 30 years who is asymptomatic.  STDs are not a concern today.  Has not had intercourse between finishing her last course of Keflex and her symptoms returning.  She was seen here on 11/7 with dysuria.  She had just finished a course of Keflex, urine culture had multiple bacteria.  Repeat urinalysis was inconclusive for UTI, culture was sent.  She was placed on Keflex pending culture results.  Urine culture grew out Proteus Mirabilis, but it was sensitive to Keflex.  She states that she finished this on 11/12, and her symptoms resolved for 3 or 4 days, but they returned yesterday.  She has a past medical history of hypertension, hypercholesterolemia, compression fracture in her back.  No history of diabetes, gonorrhea, chlamydia, HIV, HSV, syphilis, Trichomonas, BV, yeast.  PMD: Marygrace Drought, MD   Past Medical History:  Diagnosis Date  . Anxiety   . Hypertension   . Pain    sharp epigastric pain "stress"  . Wears dentures    partial upper    Past Surgical History:  Procedure Laterality Date  . CHOLECYSTECTOMY N/A 03/29/2015   Procedure: LAPAROSCOPIC CHOLECYSTECTOMY;  Surgeon: Marlyce Huge, MD;  Location: [facility];  Service: General;  Laterality: N/A;  . HERNIA REPAIR  03/25/2016   Ventral hernia repair with 6.4 cm Ventrio ST mesh  . MASS EXCISION Right 02/28/2017   Procedure: EXCISION oral lesion;  Surgeon: Margaretha Sheffield, MD;  Location: [place];  Service: ENT;  Laterality: Right;  . VENTRAL HERNIA REPAIR N/A 03/25/2016   Procedure: HERNIA REPAIR VENTRAL ADULT;  Surgeon: Robert Bellow, MD;   Location: [facility];  Service: General;  Laterality: N/A;    Family History  Problem Relation Age of Onset  . Kidney cancer Mother     Social History   Tobacco Use  . Smoking status: Never Smoker  . Smokeless tobacco: Never Used  Substance Use Topics  . Alcohol use: No  . Drug use: No    No current facility-administered medications for this encounter.   Current Outpatient Medications:  .  amLODipine (NORVASC) 5 MG tablet, amlodipine 5 mg tablet, Disp: , Rfl:  .  cholecalciferol (VITAMIN D) 400 units TABS tablet, Take 400 Units by mouth daily., Disp: , Rfl:  .  hydrochlorothiazide (HYDRODIURIL) 25 MG tablet, hydrochlorothiazide 25 mg tablet, Disp: , Rfl:  .  losartan (COZAAR) 50 MG tablet, Take 50 mg by mouth daily., Disp: , Rfl:  .  phenazopyridine (PYRIDIUM) 200 MG tablet, Take 1 tablet (200 mg total) by mouth 3 (three) times daily as needed for pain., Disp: 6 tablet, Rfl: 0 .  sulfamethoxazole-trimethoprim (BACTRIM DS,SEPTRA DS) 800-160 MG tablet, Take 1 tablet by mouth 2 (two) times daily for 5 days., Disp: 10 tablet, Rfl: 0  No Known Allergies   ROS  As noted in HPI.   Physical Exam  BP (!) 160/86 (BP Location: Right Arm)   Pulse 88   Temp 97.8 F (36.6 C) (Oral)   Resp 18   Ht 5' (1.524 m)   Wt 80.7 kg   SpO2 100%   BMI 34.76 kg/m   Constitutional:  Well developed, well nourished, no acute distress Eyes:  EOMI, conjunctiva normal bilaterally HENT: Normocephalic, atraumatic,mucus membranes moist Respiratory: Normal inspiratory effort Cardiovascular: Normal rate GI: nondistended soft.  No suprapubic or flank tenderness.   Back: No CVAT  skin: No rash, skin intact Musculoskeletal: no deformities Neurologic: Alert & oriented x 3, no focal neuro deficits Psychiatric: Speech and behavior appropriate   ED Course   Medications - No data to display  Orders Placed This Encounter  Procedures  . Urine culture    Standing Status:   Standing    Number of  Occurrences:   1  . Wet prep, genital    Standing Status:   Standing    Number of Occurrences:   1  . Urinalysis, Complete w Microscopic    Standing Status:   Standing    Number of Occurrences:   1    Results for orders placed or performed during the hospital encounter of 08/24/18 (from the past 24 hour(s))  Urinalysis, Complete w Microscopic     Status: Abnormal   Collection Time: 08/24/18  5:41 PM  Result Value Ref Range   Color, Urine YELLOW YELLOW   APPearance HAZY (A) CLEAR   Specific Gravity, Urine 1.020 1.005 - 1.030   pH 6.5 5.0 - 8.0   Glucose, UA NEGATIVE NEGATIVE mg/dL   Hgb urine dipstick NEGATIVE NEGATIVE   Bilirubin Urine NEGATIVE NEGATIVE   Ketones, ur NEGATIVE NEGATIVE mg/dL   Protein, ur NEGATIVE NEGATIVE mg/dL   Nitrite NEGATIVE NEGATIVE   Leukocytes, UA SMALL (A) NEGATIVE   Squamous Epithelial / LPF 0-5 0 - 5   WBC, UA 11-20 0 - 5 WBC/hpf   RBC / HPF 0-5 0 - 5 RBC/hpf   Bacteria, UA MANY (A) NONE SEEN  Wet prep, genital     Status: Abnormal   Collection Time: 08/24/18  6:44 PM  Result Value Ref Range   Yeast Wet Prep HPF POC NONE SEEN NONE SEEN   Trich, Wet Prep NONE SEEN NONE SEEN   Clue Cells Wet Prep HPF POC NONE SEEN NONE SEEN   WBC, Wet Prep HPF POC MODERATE (A) NONE SEEN   Sperm NONE SEEN    No results found.  ED Clinical Impression  Dysuria  Urinary tract infection without hematuria, site unspecified   ED Assessment/Plan  Previous records, urine cultures reviewed.  As noted in HPI.  Today's UA suggestive of a UTI with small leukocytes, pyuria and many bacteria.  Sending this off for culture to confirm diagnosis of UTI and antibiotic choice.  However will also check a wet prep due to the recent multiple rounds of antibiotics, as she could also have a concurrent BV or yeast infection.  Wet prep negative for BV, yeast.  Plan to send home with Bactrim 5 days. Pyridium.  Last urine culture was resistant to Macrobid.  She will need to  follow-up with her primary care physician in several days, she is to go to the ER if she gets worse.  Discussed labs, MDM, treatment plan, and plan for follow-up with patient. Discussed sn/sx that should prompt return to the ED. patient agrees with plan.   Meds ordered this encounter  Medications  . phenazopyridine (PYRIDIUM) 200 MG tablet    Sig: Take 1 tablet (200 mg total) by mouth 3 (three) times daily as needed for pain.    Dispense:  6 tablet    Refill:  0  . DISCONTD: sulfamethoxazole-trimethoprim (BACTRIM DS,SEPTRA DS) 800-160 MG tablet  Sig: Take 1 tablet by mouth 2 (two) times daily for 5 days.    Dispense:  6 tablet    Refill:  0  . sulfamethoxazole-trimethoprim (BACTRIM DS,SEPTRA DS) 800-160 MG tablet    Sig: Take 1 tablet by mouth 2 (two) times daily for 5 days.    Dispense:  10 tablet    Refill:  0    *This clinic note was created using [employer]. Therefore, there may be occasional mistakes despite careful proofreading.   ?    Melynda Ripple, MD 08/24/18 2136

## 2018-08-24 NOTE — Discharge Instructions (Addendum)
Wet prep was negative for BV or yeast.  It seems that you have another urinary tract infection.  I am going to send this off for culture to make sure that you have a urinary tract infection and to make sure that we have you on the right antibiotic.  I am putting you on Bactrim for 5 days this time because you have already taken 2 rounds of Keflex.  We will call you if we need to change your antibiotics.   the Pyridium will help with your urinary symptoms.  Make sure you continue pushing plenty of extra fluids.  Go to the ER for fevers above 100.4, severe abdominal or back pain, vomiting, or for any other concerns

## 2018-08-27 LAB — URINE CULTURE

## 2018-08-31 ENCOUNTER — Telehealth (HOSPITAL_COMMUNITY): Payer: Self-pay

## 2018-08-31 NOTE — Telephone Encounter (Signed)
Urine culture was positive for Proteus mirabilis  and was given septra at urgent care visit. Pt contacted and made aware, educated on completing antibiotic and to follow up if symptoms are persistent. Verbalized understanding.

## 2020-01-18 ENCOUNTER — Other Ambulatory Visit: Payer: Self-pay

## 2020-01-18 ENCOUNTER — Ambulatory Visit
Admission: EM | Admit: 2020-01-18 | Discharge: 2020-01-18 | Disposition: A | Discharge status: Home / Self Care | Payer: Medicare Other | Attending: Family Medicine | Admitting: Family Medicine

## 2020-01-18 ENCOUNTER — Encounter: Payer: Self-pay | Admitting: Emergency Medicine

## 2020-01-18 ENCOUNTER — Ambulatory Visit (INDEPENDENT_AMBULATORY_CARE_PROVIDER_SITE_OTHER): Payer: Medicare Other

## 2020-01-18 DIAGNOSIS — S60041A Contusion of right ring finger without damage to nail, initial encounter: Secondary | ICD-10-CM | POA: Diagnosis not present

## 2020-01-18 DIAGNOSIS — W230XXA Caught, crushed, jammed, or pinched between moving objects, initial encounter: Secondary | ICD-10-CM

## 2020-01-18 DIAGNOSIS — S6991XA Unspecified injury of right wrist, hand and finger(s), initial encounter: Secondary | ICD-10-CM | POA: Diagnosis not present

## 2020-01-18 DIAGNOSIS — M79641 Pain in right hand: Secondary | ICD-10-CM

## 2020-01-18 MED ORDER — TRAMADOL HCL 50 MG PO TABS: 50.0000 mg | ORAL_TABLET | Freq: Two times a day (BID) | ORAL | 0 refills | Status: DC | PRN

## 2020-01-18 NOTE — ED Triage Notes (Signed)
Patient shut her right hand in a door. Patient is c/o pain, bruising and swelling to her hand.

## 2020-01-18 NOTE — Discharge Instructions (Signed)
Rest. Ice.  Medication if needed for pain.  No fracture.  Take care  Dr. Lacinda Axon

## 2020-01-19 NOTE — ED Provider Notes (Signed)
[facility]    CSN: [id number] Arrival date & time: 01/18/20  1945   History   Chief Complaint Chief Complaint  Patient presents with  . Hand Injury    right   HPI  74 year old female presents with the above complaint.  Patient reports that her right hand was shut in a door earlier today. Pain reports pain of the hand. Pain 7/10 in severity. She has some bruising to the ring finger. No wrist injury or pain.  No medications or images tried.  No relieving factors.  No other reported symptoms.  No other complaints.  Past Medical History:  Diagnosis Date  . Anxiety   . Hypertension   . Pain    sharp epigastric pain "stress"  . Wears dentures    partial upper   Patient Active Problem List   Diagnosis Date Noted  . Ventral hernia without obstruction or gangrene 02/23/2016  . Essential hypertension, benign 01/04/2016  . Pain in the chest 01/04/2016   Past Surgical History:  Procedure Laterality Date  . CHOLECYSTECTOMY N/A 03/29/2015   Procedure: LAPAROSCOPIC CHOLECYSTECTOMY;  Surgeon: Marlyce Huge, MD;  Location: [facility];  Service: General;  Laterality: N/A;  . HERNIA REPAIR  03/25/2016   Ventral hernia repair with 6.4 cm Ventrio ST mesh  . MASS EXCISION Right 02/28/2017   Procedure: EXCISION oral lesion;  Surgeon: Margaretha Sheffield, MD;  Location: [place];  Service: ENT;  Laterality: Right;  . VENTRAL HERNIA REPAIR N/A 03/25/2016   Procedure: HERNIA REPAIR VENTRAL ADULT;  Surgeon: Robert Bellow, MD;  Location: [facility];  Service: General;  Laterality: N/A;   OB History    Gravida  3   Para      Term      Preterm      AB      Living  3     SAB      TAB      Ectopic      Multiple      Live Births           Obstetric Comments  Menstrual age: 60  Age 1st Pregnancy: 55       Home Medications    Prior to Admission medications   Medication Sig Start Date End Date Taking? Authorizing Provider  amLODipine (NORVASC) 5 MG  tablet amlodipine 5 mg tablet 01/04/16  Yes [provider]  cholecalciferol (VITAMIN D) 400 units TABS tablet Take 400 Units by mouth daily.   Yes [provider]  hydrochlorothiazide (HYDRODIURIL) 25 MG tablet hydrochlorothiazide 25 mg tablet 06/04/15  Yes [provider]  losartan (COZAAR) 50 MG tablet Take 50 mg by mouth daily.   Yes [provider]  phenazopyridine (PYRIDIUM) 200 MG tablet Take 1 tablet (200 mg total) by mouth 3 (three) times daily as needed for pain. 08/24/18   Melynda Ripple, MD  traMADol (ULTRAM) 50 MG tablet Take 1 tablet (50 mg total) by mouth every 12 (twelve) hours as needed for moderate pain. 01/18/20   Coral Spikes, DO   Family History Family History  Problem Relation Age of Onset  . Kidney cancer Mother    Social History Social History   Tobacco Use  . Smoking status: Never Smoker  . Smokeless tobacco: Never Used  Substance Use Topics  . Alcohol use: No  . Drug use: No   Allergies   Patient has no known allergies.   Review of Systems Review of Systems  Constitutional:  Negative.   Musculoskeletal:       Hand injury (right).   Physical Exam Triage Vital Signs ED Triage Vitals  Enc Vitals Group     BP 01/18/20 1959 131/64     Pulse Rate 01/18/20 1959 75     Resp 01/18/20 1959 18     Temp 01/18/20 1959 98.2 F (36.8 C)     Temp Source 01/18/20 1959 Oral     SpO2 01/18/20 1959 100 %     Weight 01/18/20 1958 178 lb (80.7 kg)     Height 01/18/20 1958 5' (1.524 m)     Head Circumference --      Peak Flow --      Pain Score 01/18/20 1958 7     Pain Loc --      Pain Edu? --      Excl. in [place]? --    Updated Vital Signs BP 131/64 (BP Location: Left Arm)   Pulse 75   Temp 98.2 F (36.8 C) (Oral)   Resp 18   Ht 5' (1.524 m)   Wt 80.7 kg   SpO2 100%   BMI 34.76 kg/m   Visual Acuity Right Eye Distance:   Left Eye Distance:   Bilateral Distance:    Right Eye Near:   Left Eye Near:    Bilateral  Near:     Physical Exam Vitals and nursing note reviewed.  Constitutional:      General: She is not in acute distress.    Appearance: Normal appearance. She is not ill-appearing.  HENT:     Head: Normocephalic and atraumatic.  Eyes:     General:        Right eye: No discharge.        Left eye: No discharge.     Conjunctiva/sclera: Conjunctivae normal.  Cardiovascular:     Rate and Rhythm: Normal rate and regular rhythm.     Heart sounds: No murmur.  Pulmonary:     Effort: Pulmonary effort is normal.     Breath sounds: Normal breath sounds. No wheezing, rhonchi or rales.  Musculoskeletal:     Comments: Right hand - palmar aspect of the right ring finger with mild swelling and bruising. No other discrete areas of tenderness.  Neurological:     Mental Status: She is alert.  Psychiatric:        Mood and Affect: Mood normal.        Behavior: Behavior normal.    UC Treatments / Results  Labs (all labs ordered are listed, but only abnormal results are displayed) Labs Reviewed - No data to display  EKG   Radiology DG Hand Complete Right  Result Date: 01/18/2020 CLINICAL DATA:  Right hand injury EXAM: RIGHT HAND - COMPLETE 3+ VIEW COMPARISON:  None. FINDINGS: No acute displaced fracture or malalignment. No radiopaque foreign body. 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 IMPRESSION: No acute osseous abnormality Electronically Signed   By: Donavan Foil M.D.   On: 01/18/2020 20:05    Procedures Procedures (including critical care time)  Medications Ordered in UC Medications - No data to display  Initial Impression / Assessment and Plan / UC Course  I have reviewed the triage vital signs and the nursing notes.  Pertinent labs & imaging results that were available during my care of the patient were reviewed by me and considered in my medical decision making (see chart for details).    74 year old female presents with a  hand injury. Xray obtained and independently  reviewed by me. Interpretation: No acute fracture. Osteoarthritis noted. Tramadol as needed. Supportive care.   Final Clinical Impressions(s) / UC Diagnoses   Final diagnoses:  Contusion of right ring finger without damage to nail, initial encounter  Injury of right hand, initial encounter     Discharge Instructions     Rest. Ice.  Medication if needed for pain.  No fracture.  Take care  Dr. Lacinda Axon    ED Prescriptions    Medication Sig Dispense Auth. Provider   traMADol (ULTRAM) 50 MG tablet Take 1 tablet (50 mg total) by mouth every 12 (twelve) hours as needed for moderate pain. 10 tablet Thersa Salt G, DO     I have reviewed the PDMP during this encounter.   Coral Spikes, [place] 01/19/20 [phone]

## 2020-04-20 ENCOUNTER — Encounter: Payer: Self-pay | Admitting: Emergency Medicine

## 2020-04-20 ENCOUNTER — Ambulatory Visit
Admission: EM | Admit: 2020-04-20 | Discharge: 2020-04-20 | Disposition: A | Discharge status: Home / Self Care | Payer: Medicare Other | Attending: Family Medicine | Admitting: Family Medicine

## 2020-04-20 DIAGNOSIS — M25572 Pain in left ankle and joints of left foot: Secondary | ICD-10-CM

## 2020-04-20 MED ORDER — NAPROXEN 500 MG PO TABS: 500.0000 mg | ORAL_TABLET | Freq: Two times a day (BID) | ORAL | 0 refills | Status: AC | PRN

## 2020-04-20 NOTE — ED Provider Notes (Signed)
[facility]    CSN: [id number] Arrival date & time: 04/20/20  0804  History   Chief Complaint Chief Complaint  Patient presents with   Ankle Pain   HPI  74 year old female presents with ankle pain.  Patient reports that she has had left ankle pain since Monday.  Denies fall, trauma, injury.  No swelling.  Patient states that it hurts particularly on the lateral aspect of her ankle.  No apparent bruising.  She has taken Advil without relief.  Rates her pain is 7/10 in severity.  No known exacerbating or relieving factors.  No other associated symptoms.  No other complaints.  Past Medical History:  Diagnosis Date   Anxiety    Hypertension    Pain    sharp epigastric pain "stress"   Wears dentures    partial upper    Patient Active Problem List   Diagnosis Date Noted   Ventral hernia without obstruction or gangrene 02/23/2016   Essential hypertension, benign 01/04/2016   Pain in the chest 01/04/2016    Past Surgical History:  Procedure Laterality Date   CHOLECYSTECTOMY N/A 03/29/2015   Procedure: LAPAROSCOPIC CHOLECYSTECTOMY;  Surgeon: Marlyce Huge, MD;  Location: [facility];  Service: General;  Laterality: N/A;   HERNIA REPAIR  03/25/2016   Ventral hernia repair with 6.4 cm Ventrio ST mesh   MASS EXCISION Right 02/28/2017   Procedure: EXCISION oral lesion;  Surgeon: Margaretha Sheffield, MD;  Location: [place];  Service: ENT;  Laterality: Right;   VENTRAL HERNIA REPAIR N/A 03/25/2016   Procedure: HERNIA REPAIR VENTRAL ADULT;  Surgeon: Robert Bellow, MD;  Location: [facility];  Service: General;  Laterality: N/A;    OB History    Gravida  3   Para      Term      Preterm      AB      Living  3     SAB      TAB      Ectopic      Multiple      Live Births           Obstetric Comments  Menstrual age: 56  Age 1st Pregnancy: 58         Home Medications    Prior to Admission medications   Medication Sig  Start Date End Date Taking? Authorizing Provider  amLODipine (NORVASC) 5 MG tablet amlodipine 5 mg tablet 01/04/16  Yes [provider]  cholecalciferol (VITAMIN D) 400 units TABS tablet Take 400 Units by mouth daily.   Yes [provider]  hydrochlorothiazide (HYDRODIURIL) 25 MG tablet hydrochlorothiazide 25 mg tablet 06/04/15  Yes [provider]  losartan (COZAAR) 50 MG tablet Take 50 mg by mouth daily.   Yes [provider]  naproxen (NAPROSYN) 500 MG tablet Take 1 tablet (500 mg total) by mouth 2 (two) times daily as needed for moderate pain. 04/20/20   Coral Spikes, DO    Family History Family History  Problem Relation Age of Onset   Kidney cancer Mother     Social History Social History   Tobacco Use   Smoking status: Never Smoker   Smokeless tobacco: Never Used  Vaping Use   Vaping Use: Never used  Substance Use Topics   Alcohol use: No   Drug use: No     Allergies   Patient has no known allergies.   Review of Systems Review of Systems  Constitutional: Negative.   Musculoskeletal:  Left ankle pain.   Physical Exam Triage Vital Signs ED Triage Vitals  Enc Vitals Group     BP 04/20/20 0820 (!) 152/73     Pulse Rate 04/20/20 0820 60     Resp 04/20/20 0820 18     Temp 04/20/20 0820 98.8 F (37.1 C)     Temp Source 04/20/20 0820 Oral     SpO2 04/20/20 0820 99 %     Weight 04/20/20 0819 177 lb 14.6 oz (80.7 kg)     Height 04/20/20 0819 5' (1.524 m)     Head Circumference --      Peak Flow --      Pain Score 04/20/20 0819 7     Pain Loc --      Pain Edu? --      Excl. in [place]? --    Updated Vital Signs BP (!) 152/73 (BP Location: Right Arm)    Pulse 60    Temp 98.8 F (37.1 C) (Oral)    Resp 18    Ht 5' (1.524 m)    Wt 80.7 kg    SpO2 99%    BMI 34.75 kg/m   Visual Acuity Right Eye Distance:   Left Eye Distance:   Bilateral Distance:    Right Eye Near:   Left Eye Near:    Bilateral Near:     Physical  Exam Vitals and nursing note reviewed.  Constitutional:      General: She is not in acute distress.    Appearance: Normal appearance. She is not ill-appearing.  HENT:     Head: Normocephalic and atraumatic.  Eyes:     General:        Right eye: No discharge.        Left eye: No discharge.     Conjunctiva/sclera: Conjunctivae normal.  Cardiovascular:     Rate and Rhythm: Normal rate and regular rhythm.  Pulmonary:     Effort: Pulmonary effort is normal.     Breath sounds: Normal breath sounds.  Musculoskeletal:     Comments: Left ankle -no swelling.  No discrete areas of tenderness.  No erythema or warmth.  Neurological:     Mental Status: She is alert.  Psychiatric:        Mood and Affect: Mood normal.        Behavior: Behavior normal.    UC Treatments / Results  Labs (all labs ordered are listed, but only abnormal results are displayed) Labs Reviewed - No data to display  EKG   Radiology No results found.  Procedures Procedures (including critical care time)  Medications Ordered in UC Medications - No data to display  Initial Impression / Assessment and Plan / UC Course  I have reviewed the triage vital signs and the nursing notes.  Pertinent labs & imaging results that were available during my care of the patient were reviewed by me and considered in my medical decision making (see chart for details).    74 year old female presents with atraumatic left ankle pain.  Her exam is unremarkable.  No indications for imaging.  Naproxen as directed.  Advised rest, ice, elevation.  Follow-up with PCP.  Final Clinical Impressions(s) / UC Diagnoses   Final diagnoses:  Acute left ankle pain     Discharge Instructions     Rest, ice, and elevate.  Medication as needed.  Please follow up with your primary care physician.  Take care  Dr. Lacinda Axon    ED Prescriptions  Medication Sig Dispense Auth. Provider   naproxen (NAPROSYN) 500 MG tablet Take 1 tablet (500  mg total) by mouth 2 (two) times daily as needed for moderate pain. 30 tablet Coral Spikes, DO     PDMP not reviewed this encounter.   Thersa Salt [place], [place] 04/20/20 [phone]

## 2020-04-20 NOTE — ED Triage Notes (Signed)
Patient c/o left ankle pain that started on Monday. She denies injury.

## 2020-04-20 NOTE — Discharge Instructions (Addendum)
Rest, ice, and elevate.  Medication as needed.  Please follow up with your primary care physician.  Take care  Dr. Lacinda Axon

## 2020-09-26 ENCOUNTER — Other Ambulatory Visit: Payer: Self-pay

## 2020-09-26 ENCOUNTER — Encounter: Payer: Self-pay | Admitting: Ophthalmology

## 2020-10-03 ENCOUNTER — Other Ambulatory Visit: Payer: Self-pay

## 2020-10-03 ENCOUNTER — Other Ambulatory Visit
Admission: RE | Admit: 2020-10-03 | Discharge: 2020-10-03 | Disposition: A | Discharge status: Home / Self Care | Payer: Medicare Other | Source: Ambulatory Visit | Attending: Ophthalmology | Admitting: Ophthalmology

## 2020-10-03 DIAGNOSIS — Z01812 Encounter for preprocedural laboratory examination: Secondary | ICD-10-CM | POA: Insufficient documentation

## 2020-10-03 DIAGNOSIS — Z20822 Contact with and (suspected) exposure to covid-19: Secondary | ICD-10-CM | POA: Diagnosis not present

## 2020-10-03 LAB — SARS CORONAVIRUS 2 (TAT 6-24 HRS): SARS Coronavirus 2: NEGATIVE

## 2020-10-03 NOTE — Discharge Instructions (Signed)
INSTRUCTIONS FOLLOWING OCULOPLASTIC SURGERY AMY M. FOWLER, MD  AFTER YOUR EYE SURGERY, THER ARE MANY THINGS WHICH YOU, THE PATIENT, CAN DO TO ASSURE THE BEST POSSIBLE RESULT FROM YOUR OPERATION.  THIS SHEET SHOULD BE REFERRED TO WHENEVER QUESTIONS ARISE.  IF THERE ARE ANY QUESTIONS NOT ANSWERED HERE, DO NOT HESITATE TO CALL OUR OFFICE AT [phone] OR [phone].  THERE IS ALWAYS SOMEONE AVAILABLE TO CALL IF QUESTIONS OR PROBLEMS ARISE.  VISION: Your vision may be blurred and out of focus after surgery until you are able to stop using your ointment, swelling resolves and your eye(s) heal. This may take 1 to 2 weeks at the least.  If your vision becomes gradually more dim or dark, this is not normal and you need to call our office immediately.  EYE CARE: For the first 48 hours after surgery, use ice packs frequently - "20 minutes on, 20 minutes off" - to help reduce swelling and bruising.  Small bags of frozen peas or corn make good ice packs along with cloths soaked in ice water.  If you are wearing a patch or other type of dressing following surgery, keep this on for the amount of time specified by your doctor.  For the first week following surgery, you will need to treat your stitches with great care.  It is OK to shower, but take care to not allow soapy water to run into your eye(s) to help reduce chances of infection.  You may gently clean the eyelashes and around the eye(s) with cotton balls and sterile water, BUT DO NOT RUB THE STITCHES VIGOROUSLY.  Keeping your stitches moist with ointment will help promote healing with minimal scar formation.  ACTIVITY: When you leave the surgery center, you should go home, rest and be inactive.  The eye(s) may feel scratchy and keeping the eyes closed will allow for faster healing.  The first week following surgery, avoid straining (anything making the face turn red) or lifting over 20 pounds.  Additionally, avoid bending which causes your head to go below  your waist.  Using your eyes will NOT harm them, so feel free to read, watch television, use the computer, etc as desired.  Driving depends on each individual, so check with your doctor if you have questions about driving. Do not wear contact lenses for about 2 weeks.  Do not wear eye makeup for 2 weeks.  Avoid swimming, hot tubs, gardening, and dusting for 1 to 2 weeks to reduce the risk of an infection.  MEDICATIONS:  You will be given a prescription for an ointment to use 4 times a day on your stitches.  You can use the ointment in your eyes if they feel scratchy or irritated.  If you eyelid(s) don't close completely when you sleep, put some ointment in your eyes before bedtime.  EMERGENCY: If you experience SEVERE EYE PAIN OR HEADACHE UNRELIEVED BY TYLENOL OR TRAMADOL, NAUSEA OR VOMITING, WORSENING REDNESS, OR WORSENING VISION (ESPECIALLY VISION THAT WAS INITIALLY BETTER) CALL [phone] OR [phone] DURING BUSINESS HOURS OR AFTER HOURS.  General Anesthesia, Adult, Care After This sheet gives you information about how to care for yourself after your procedure. Your health care provider may also give you more specific instructions. If you have problems or questions, contact your health care provider. What can I expect after the procedure? After the procedure, the following side effects are common:  Pain or discomfort at the IV site.  Nausea.  Vomiting.  Sore throat.  Trouble concentrating.    Feeling cold or chills.  Weak or tired.  Sleepiness and fatigue.  Soreness and body aches. These side effects can affect parts of the body that were not involved in surgery. Follow these instructions at home:  For at least 24 hours after the procedure:  Have a responsible adult stay with you. It is important to have someone help care for you until you are awake and alert.  Rest as needed.  Do not: ? Participate in activities in which you could fall or become injured. ? Drive. ? Use  heavy machinery. ? Drink alcohol. ? Take sleeping pills or medicines that cause drowsiness. ? Make important decisions or sign legal documents. ? Take care of children on your own. Eating and drinking  Follow any instructions from your health care provider about eating or drinking restrictions.  When you feel hungry, start by eating small amounts of foods that are soft and easy to digest (bland), such as toast. Gradually return to your regular diet.  Drink enough fluid to keep your urine pale yellow.  If you vomit, rehydrate by drinking water, juice, or clear broth. General instructions  If you have sleep apnea, surgery and certain medicines can increase your risk for breathing problems. Follow instructions from your health care provider about wearing your sleep device: ? Anytime you are sleeping, including during daytime naps. ? While taking prescription pain medicines, sleeping medicines, or medicines that make you drowsy.  Return to your normal activities as told by your health care provider. Ask your health care provider what activities are safe for you.  Take over-the-counter and prescription medicines only as told by your health care provider.  If you smoke, do not smoke without supervision.  Keep all follow-up visits as told by your health care provider. This is important. Contact a health care provider if:  You have nausea or vomiting that does not get better with medicine.  You cannot eat or drink without vomiting.  You have pain that does not get better with medicine.  You are unable to pass urine.  You develop a skin rash.  You have a fever.  You have redness around your IV site that gets worse. Get help right away if:  You have difficulty breathing.  You have chest pain.  You have blood in your urine or stool, or you vomit blood. Summary  After the procedure, it is common to have a sore throat or nausea. It is also common to feel tired.  Have a  responsible adult stay with you for the first 24 hours after general anesthesia. It is important to have someone help care for you until you are awake and alert.  When you feel hungry, start by eating small amounts of foods that are soft and easy to digest (bland), such as toast. Gradually return to your regular diet.  Drink enough fluid to keep your urine pale yellow.  Return to your normal activities as told by your health care provider. Ask your health care provider what activities are safe for you. This information is not intended to replace advice given to you by your health care provider. Make sure you discuss any questions you have with your health care provider. Document Revised: 09/26/2017 Document Reviewed: 05/09/2017 Elsevier Patient Education  2020 Elsevier Inc[employer]  

## 2020-10-05 ENCOUNTER — Encounter: Payer: Self-pay | Admitting: Ophthalmology

## 2020-10-05 ENCOUNTER — Ambulatory Visit
Admission: RE | Admit: 2020-10-05 | Discharge: 2020-10-05 | Disposition: A | Discharge status: Home / Self Care | Payer: Medicare Other | Attending: Ophthalmology | Admitting: Ophthalmology

## 2020-10-05 ENCOUNTER — Ambulatory Visit: Payer: Medicare Other | Admitting: Anesthesiology

## 2020-10-05 ENCOUNTER — Encounter
Admission: RE | Disposition: A | Discharge status: Home / Self Care | Payer: Self-pay | Source: Home / Self Care | Attending: Ophthalmology

## 2020-10-05 ENCOUNTER — Other Ambulatory Visit: Payer: Self-pay

## 2020-10-05 DIAGNOSIS — H57813 Brow ptosis, bilateral: Secondary | ICD-10-CM | POA: Insufficient documentation

## 2020-10-05 DIAGNOSIS — H02831 Dermatochalasis of right upper eyelid: Secondary | ICD-10-CM | POA: Diagnosis not present

## 2020-10-05 DIAGNOSIS — H02834 Dermatochalasis of left upper eyelid: Secondary | ICD-10-CM | POA: Insufficient documentation

## 2020-10-05 DIAGNOSIS — Z79899 Other long term (current) drug therapy: Secondary | ICD-10-CM | POA: Insufficient documentation

## 2020-10-05 HISTORY — PX: BROW LIFT: SHX178

## 2020-10-05 SURGERY — BLEPHAROPLASTY
Anesthesia: General | Laterality: Bilateral

## 2020-10-05 MED ORDER — FENTANYL CITRATE (PF) 100 MCG/2ML IJ SOLN: 25.0000 ug | INTRAMUSCULAR | Status: DC | PRN

## 2020-10-05 MED ORDER — TRAMADOL HCL 50 MG PO TABS: ORAL_TABLET | ORAL | 0 refills | Status: DC

## 2020-10-05 MED ORDER — LACTATED RINGERS IV SOLN: INTRAVENOUS | Status: DC

## 2020-10-05 MED ORDER — OXYCODONE HCL 5 MG PO TABS
5.0000 mg | ORAL_TABLET | Freq: Once | ORAL | Status: AC | PRN
  Administered 2020-10-05: 5 mg via ORAL

## 2020-10-05 MED ORDER — TETRACAINE HCL 0.5 % OP SOLN
OPHTHALMIC | Status: DC | PRN
  Administered 2020-10-05: 2 [drp] via OPHTHALMIC

## 2020-10-05 MED ORDER — ALFENTANIL 500 MCG/ML IJ INJ
INJECTION | INTRAVENOUS | Status: DC | PRN
  Administered 2020-10-05: 500 ug via INTRAVENOUS
  Administered 2020-10-05: 250 ug via INTRAVENOUS

## 2020-10-05 MED ORDER — LIDOCAINE-EPINEPHRINE 2 %-1:100000 IJ SOLN
INTRAMUSCULAR | Status: DC | PRN
  Administered 2020-10-05: 10:00:00 8 mL via OPHTHALMIC

## 2020-10-05 MED ORDER — ERYTHROMYCIN 5 MG/GM OP OINT: TOPICAL_OINTMENT | OPHTHALMIC | 2 refills | Status: AC

## 2020-10-05 MED ORDER — MIDAZOLAM HCL 2 MG/2ML IJ SOLN
INTRAMUSCULAR | Status: DC | PRN
  Administered 2020-10-05 (×2): 1 mg via INTRAVENOUS

## 2020-10-05 MED ORDER — OXYCODONE HCL 5 MG/5ML PO SOLN: 5.0000 mg | Freq: Once | ORAL | Status: AC | PRN

## 2020-10-05 SURGICAL SUPPLY — 36 items
APPLICATOR COTTON TIP WD 3 STR (MISCELLANEOUS) ×2 IMPLANT
BLADE SURG 15 STRL LF DISP TIS (BLADE) ×1 IMPLANT
BLADE SURG 15 STRL SS (BLADE) ×2
CORD BIP STRL DISP 12FT (MISCELLANEOUS) ×2 IMPLANT
DRAPE HEAD BAR (DRAPES) ×2 IMPLANT
GAUZE SPONGE 4X4 12PLY STRL (GAUZE/BANDAGES/DRESSINGS) ×2 IMPLANT
GLOVE SURG LX 7.0 MICRO (GLOVE) ×2
GLOVE SURG LX STRL 7.0 MICRO (GLOVE) ×2 IMPLANT
GOWN STRL REUS W/ TWL LRG LVL3 (GOWN DISPOSABLE) ×1 IMPLANT
GOWN STRL REUS W/TWL LRG LVL3 (GOWN DISPOSABLE) ×2
MARKER SKIN XFINE TIP W/RULER (MISCELLANEOUS) ×2 IMPLANT
NEEDLE FILTER BLUNT 18X 1/2SAF (NEEDLE) ×1
NEEDLE FILTER BLUNT 18X1 1/2 (NEEDLE) ×1 IMPLANT
NEEDLE HYPO 30X.5 LL (NEEDLE) ×4 IMPLANT
PACK ENT CUSTOM (PACKS) ×2 IMPLANT
SOL PREP PVP 2OZ (MISCELLANEOUS) ×2
SOLUTION PREP PVP 2OZ (MISCELLANEOUS) ×1 IMPLANT
SPONGE GAUZE 2X2 8PLY STRL LF (GAUZE/BANDAGES/DRESSINGS) ×20 IMPLANT
SUT CHROMIC 4-0 (SUTURE)
SUT CHROMIC 4-0 M2 12X2 ARM (SUTURE)
SUT CHROMIC 5 0 P 3 (SUTURE) ×4 IMPLANT
SUT ETHILON 4 0 CL P 3 (SUTURE) IMPLANT
SUT GUT PLAIN 6-0 1X18 ABS (SUTURE) ×2 IMPLANT
SUT MERSILENE 4-0 S-2 (SUTURE) IMPLANT
SUT PROLENE 5 0 P 3 (SUTURE) ×4 IMPLANT
SUT PROLENE 6 0 P 1 18 (SUTURE) IMPLANT
SUT SILK 4 0 G 3 (SUTURE) IMPLANT
SUT VIC AB 5-0 P-3 18X BRD (SUTURE) IMPLANT
SUT VIC AB 5-0 P3 18 (SUTURE)
SUT VICRYL 6-0  S14 CTD (SUTURE)
SUT VICRYL 6-0 S14 CTD (SUTURE) IMPLANT
SUT VICRYL 7 0 TG140 8 (SUTURE) IMPLANT
SUTURE CHRMC 4-0 M2 12X2 ARM (SUTURE) IMPLANT
SYR 10ML LL (SYRINGE) ×2 IMPLANT
SYR 3ML LL SCALE MARK (SYRINGE) ×2 IMPLANT
WATER STERILE IRR 250ML POUR (IV SOLUTION) ×2 IMPLANT

## 2020-10-05 NOTE — Transfer of Care (Signed)
Immediate Anesthesia Transfer of Care Note  Patient: Anne Castillo  Procedure(s) Performed: BLEPHAROPLASTY UPPER EYELID; W/EXCESS SKIN BROW PTOSIS REPAIR BILATERAL (Bilateral )  Patient Location: PACU  Anesthesia Type: General  Level of Consciousness: awake, alert  and patient cooperative  Airway and Oxygen Therapy: Patient Spontanous Breathing and Patient connected to supplemental oxygen  Post-op Assessment: Post-op Vital signs reviewed, Patient's Cardiovascular Status Stable, Respiratory Function Stable, Patent Airway and No signs of Nausea or vomiting  Post-op Vital Signs: Reviewed and stable  Complications: No complications documented.

## 2020-10-05 NOTE — Anesthesia Preprocedure Evaluation (Signed)
Anesthesia Evaluation  Patient identified by MRN, date of birth, ID band Patient awake    Reviewed: Allergy & Precautions, H&P , NPO status , Patient's Chart, lab work & pertinent test results  History of Anesthesia Complications Negative for: history of anesthetic complications  Airway Mallampati: II  TM Distance: >3 FB Neck ROM: full    Dental no notable dental hx.    Pulmonary neg pulmonary ROS,    Pulmonary exam normal        Cardiovascular hypertension, On Medications Normal cardiovascular exam Rhythm:regular Rate:Normal     Neuro/Psych negative neurological ROS     GI/Hepatic negative GI ROS, Neg liver ROS,   Endo/Other  negative endocrine ROS  Renal/GU negative Renal ROS  negative genitourinary   Musculoskeletal   Abdominal   Peds  Hematology negative hematology ROS (+)   Anesthesia Other Findings   Reproductive/Obstetrics                             Anesthesia Physical Anesthesia Plan  ASA: II  Anesthesia Plan: General   Post-op Pain Management:    Induction:   PONV Risk Score and Plan: 3 and Treatment may vary due to age or medical condition  Airway Management Planned:   Additional Equipment:   Intra-op Plan:   Post-operative Plan:   Informed Consent: I have reviewed the patients History and Physical, chart, labs and discussed the procedure including the risks, benefits and alternatives for the proposed anesthesia with the patient or authorized representative who has indicated his/her understanding and acceptance.       Plan Discussed with:   Anesthesia Plan Comments:         Anesthesia Quick Evaluation

## 2020-10-05 NOTE — Anesthesia Postprocedure Evaluation (Signed)
Anesthesia Post Note  Patient: Anne Castillo  Procedure(s) Performed: BLEPHAROPLASTY UPPER EYELID; W/EXCESS SKIN BROW PTOSIS REPAIR BILATERAL (Bilateral )     Patient location during evaluation: PACU Anesthesia Type: General Level of consciousness: awake and alert Pain management: pain level controlled Vital Signs Assessment: post-procedure vital signs reviewed and stable Respiratory status: spontaneous breathing Cardiovascular status: stable Anesthetic complications: no   No complications documented.  Marvis Repress

## 2020-10-05 NOTE — Op Note (Signed)
Preoperative Diagnosis:   1.  Visually significant bilateral brow ptosis.  2.  Visually significant dermatochalasis bilateral Upper Eyelid(s)  Postoperative Diagnosis: Same.   Procedure(s) Performed:   1. bilateral  Direct brow lift to improve vision.  2.  Upper eyelid blepharoplasty with excess skin excision bilateral  Upper Eyelid(s)  Surgeon: Philis Pique. Vickki Muff, M.D.   Assistants: None   Anesthesia: MAC  Specimens: None.  Estimated Blood Loss: Minimal.  Complications: None.  Operative Findings: None Dictated  PROCEDURE:   Allergies were reviewed and the patient has No Known Allergies..   After the risks, benefits, complications and alternatives were discussed with the patient, appropriate informed consent was obtained. While seated in an upright position and looking in primary gaze, the amount of supra-brow skin to be removed was measured and marked in an elliptical pattern. The patient was then brought to the operating suite and reclined supine.  Timeout was conducted and the patient was sedated. Local anesthetic consisting of a 50-50 mixture of 2% lidocaine with epinephrine and 0.75% bupivacaine with added Hylenex was injected subcutaneously to the both  brow region(s) and down to the periosteum and  subcutaneously to both  upper eyelid(s). After adequate local was instilled, the patient was prepped and draped in the usual sterile fashion for eyelid surgery.   Attention was turned to the right brow region. A #15 blade was used to create a bevelled incision along the premarked incision line. A skin and subcutaneous tissue flap was then excised and hemostasis was obtained with bipolar cautery. The deep tissues were reapproximated with interrupted vertical 5-0 chromic sutures. The skin margin was reapproximated with a running locking 5-0 Prolene suture. Attention was then turned to the opposite brow region where the same procedure was performed in the same manner.    Attention was then  turned to the upper eyelids. A 1m upper eyelid crease incision line was marked with calipers on both  upper eyelid(s).  A pinch test was used to estimate the amount of excess skin to remove and this was marked in standard blepharoplasty style fashion. Attention was turned to the right  upper eyelid. A #15 blade was used to open the premarked incision line. A Skin and muscle flap was excised and hemostasis was obtained with bipolar cautery.   A buttonhole was created centrally in the septum to reveal the central fat pocket. This was dissected free from fascial attachments, cauterized towards the pedicle base and excised to produce a nice flattening of the central upper eyelid.  Attention was then turned to the opposite eyelid where the same procedure was performed in the same manner.   The skin incisions were closed with a combination of interrupted and  running 6-0 fast absorbing plain gut suture.   The patient tolerated the procedure well. Erythromycin ophthalmic ointment was applied to the incision site(s) followed by ice packs.The patient was taken to the recovery area where she recovered without difficulty.  Post-Op Plan/Instructions:  The patient was instructed to use ice packs frequently for the next 48 hours.  she was instructed to use Erythromycin ophthalmic ophthalmic ointment on the eyelid incisions and over-the-counter antibiotic ointment on the brow sutures 4 times a day for the next 12 to 14 days. she was given a prescription for Tramadol (or similar) for pain control should Tylenol not be effective. she was asked to to follow up in 10-12 days time for suture removal or sooner as needed for problems.   Briceyda Abdullah M. FVickki Muff M.D. Ophthalmology  2 

## 2020-10-05 NOTE — Interval H&P Note (Signed)
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  The various methods of treatment have been discussed with the patient and family. After consideration of risks, benefits and other options for treatment, the patient has consented to  Procedure(s): BLEPHAROPLASTY UPPER EYELID; W/EXCESS SKIN BROW PTOSIS REPAIR BILATERAL (Bilateral) as a surgical intervention.  The patient's history has been reviewed, patient examined, no change in status, stable for surgery.  I have reviewed the patient's chart and labs.  Questions were answered to the patient's satisfaction.     Ether Griffins, Kenyon Eshleman M

## 2020-10-05 NOTE — H&P (Signed)
See the history and physical completed at [facility] on 09/25/2020 and scanned into the chart.

## 2021-01-10 ENCOUNTER — Ambulatory Visit: Payer: Medicare Other | Admitting: Obstetrics & Gynecology

## 2021-01-17 ENCOUNTER — Ambulatory Visit: Payer: Medicare Other | Admitting: Obstetrics & Gynecology

## 2021-06-06 ENCOUNTER — Other Ambulatory Visit: Payer: Self-pay

## 2021-06-06 ENCOUNTER — Ambulatory Visit
Admission: EM | Admit: 2021-06-06 | Discharge: 2021-06-06 | Disposition: A | Discharge status: Home / Self Care | Payer: Medicare Other | Attending: Sports Medicine | Admitting: Sports Medicine

## 2021-06-06 DIAGNOSIS — R490 Dysphonia: Secondary | ICD-10-CM | POA: Diagnosis not present

## 2021-06-06 DIAGNOSIS — R059 Cough, unspecified: Secondary | ICD-10-CM | POA: Diagnosis not present

## 2021-06-06 DIAGNOSIS — J040 Acute laryngitis: Secondary | ICD-10-CM

## 2021-06-06 DIAGNOSIS — J069 Acute upper respiratory infection, unspecified: Secondary | ICD-10-CM | POA: Diagnosis not present

## 2021-06-06 MED ORDER — PROMETHAZINE-DM 6.25-15 MG/5ML PO SYRP: 5.0000 mL | ORAL_SOLUTION | Freq: Four times a day (QID) | ORAL | 0 refills | Status: AC | PRN

## 2021-06-06 NOTE — ED Provider Notes (Signed)
[facility]    CSN: [id number] Arrival date & time: 06/06/21  1657      History   Chief Complaint Chief Complaint  Patient presents with   Cough   Hoarse    HPI Anne Castillo is a 75 y.o. female.   75 year old female who presents for evaluation of some URI symptoms.  She has had hoarseness in her voice that is worse each day with cough.  She denies any significant nasal congestion, sore throat, fever shakes chills, nausea vomiting or diarrhea.  She does have a mild headache.  She took an at home COVID test today and it was negative.  She really does not want to do another test.  She denies chest pain or shortness of breath.  No wheezing or history of asthma.  No red flag signs or symptoms elicited on history.   Past Medical History:  Diagnosis Date   Anxiety    Hypertension    Pain    sharp epigastric pain "stress"   Wears dentures    partial upper    Patient Active Problem List   Diagnosis Date Noted   Ventral hernia without obstruction or gangrene 02/23/2016   Essential hypertension, benign 01/04/2016   Pain in the chest 01/04/2016    Past Surgical History:  Procedure Laterality Date   BROW LIFT Bilateral 10/05/2020   Procedure: BLEPHAROPLASTY UPPER EYELID; W/EXCESS SKIN BROW PTOSIS REPAIR BILATERAL;  Surgeon: Karle Starch, MD;  Location: [place];  Service: Ophthalmology;  Laterality: Bilateral;   CHOLECYSTECTOMY N/A 03/29/2015   Procedure: LAPAROSCOPIC CHOLECYSTECTOMY;  Surgeon: Marlyce Huge, MD;  Location: [facility];  Service: General;  Laterality: N/A;   HERNIA REPAIR  03/25/2016   Ventral hernia repair with 6.4 cm Ventrio ST mesh   MASS EXCISION Right 02/28/2017   Procedure: EXCISION oral lesion;  Surgeon: Margaretha Sheffield, MD;  Location: [place];  Service: ENT;  Laterality: Right;   VENTRAL HERNIA REPAIR N/A 03/25/2016   Procedure: HERNIA REPAIR VENTRAL ADULT;  Surgeon: Robert Bellow, MD;  Location: [facility];   Service: General;  Laterality: N/A;    OB History     Gravida  3   Para      Term      Preterm      AB      Living  3      SAB      IAB      Ectopic      Multiple      Live Births           Obstetric Comments  Menstrual age: 73  Age 1st Pregnancy: 87          Home Medications    Prior to Admission medications   Medication Sig Start Date End Date Taking? Authorizing Provider  cholecalciferol (VITAMIN D) 400 units TABS tablet Take 400 Units by mouth daily.   Yes [provider]  diclofenac (VOLTAREN) 75 MG EC tablet Take 75 mg by mouth 2 (two) times daily. 05/28/21  Yes [provider]  hydrochlorothiazide (HYDRODIURIL) 25 MG tablet 25 mg daily. 06/04/15  Yes [provider]  HYDROcodone-acetaminophen (NORCO/VICODIN) 5-325 MG tablet Take 1 tablet by mouth every 4 (four) hours. 05/05/21  Yes [provider]  losartan (COZAAR) 100 MG tablet Take 100 mg by mouth daily. 05/26/21  Yes [provider]  promethazine-dextromethorphan (PROMETHAZINE-DM) 6.25-15 MG/5ML syrup Take 5 mLs by mouth 4 (four) times daily as needed for cough.  06/06/21  Yes Verda Cumins, MD  amLODipine (NORVASC) 5 MG tablet amlodipine 5 mg tablet Patient not taking: No sig reported 01/04/16   [provider]  erythromycin ophthalmic ointment Apply to sutures 4 times a day for 10-12 days.  Discontinue if allergy develops and call our office 10/05/20   Karle Starch, MD  naproxen (NAPROSYN) 500 MG tablet Take 1 tablet (500 mg total) by mouth 2 (two) times daily as needed for moderate pain. Patient not taking: No sig reported 04/20/20   Coral Spikes, DO    Family History Family History  Problem Relation Age of Onset   Kidney cancer Mother     Social History Social History   Tobacco Use   Smoking status: Never   Smokeless tobacco: Never  Vaping Use   Vaping Use: Never used  Substance Use Topics   Alcohol use: No   Drug use: No      Allergies   Gadolinium derivatives   Review of Systems Review of Systems  Constitutional:  Negative for activity change, appetite change, chills, diaphoresis, fatigue and fever.  HENT:  Positive for voice change. Negative for congestion, ear pain, postnasal drip, rhinorrhea, sinus pressure, sinus pain, sneezing and sore throat.   Eyes:  Negative for pain.  Respiratory:  Positive for cough. Negative for chest tightness, shortness of breath and wheezing.   Cardiovascular:  Negative for chest pain and palpitations.  Gastrointestinal:  Negative for abdominal pain, diarrhea, nausea and vomiting.  Genitourinary:  Negative for dysuria.  Musculoskeletal:  Negative for back pain, myalgias and neck pain.  Skin:  Negative for color change, pallor, rash and wound.  Neurological:  Negative for dizziness, light-headedness and headaches.  All other systems reviewed and are negative.   Physical Exam Triage Vital Signs ED Triage Vitals  Enc Vitals Group     BP 06/06/21 1721 (!) 150/62     Pulse Rate 06/06/21 1721 73     Resp 06/06/21 1721 18     Temp 06/06/21 1721 98.7 F (37.1 C)     Temp Source 06/06/21 1721 Oral     SpO2 06/06/21 1721 99 %     Weight 06/06/21 1717 170 lb (77.1 kg)     Height 06/06/21 1717 5' (1.524 m)     Head Circumference --      Peak Flow --      Pain Score 06/06/21 1716 0     Pain Loc --      Pain Edu? --      Excl. in [place]? --    No data found.  Updated Vital Signs BP (!) 150/62 (BP Location: Left Arm)   Pulse 73   Temp 98.7 F (37.1 C) (Oral)   Resp 18   Ht 5' (1.524 m)   Wt 77.1 kg   SpO2 99%   BMI 33.20 kg/m   Visual Acuity Right Eye Distance:   Left Eye Distance:   Bilateral Distance:    Right Eye Near:   Left Eye Near:    Bilateral Near:     Physical Exam Vitals and nursing note reviewed.  Constitutional:      General: She is not in acute distress.    Appearance: Normal appearance. She is not ill-appearing, toxic-appearing or  diaphoretic.  HENT:     Head: Normocephalic and atraumatic.     Right Ear: Tympanic membrane normal.     Left Ear: Tympanic membrane normal.     Nose: Nose normal. No congestion  or rhinorrhea.     Mouth/Throat:     Mouth: Mucous membranes are moist.     Pharynx: Posterior oropharyngeal erythema present. No oropharyngeal exudate.  Eyes:     General:        Right eye: No discharge.        Left eye: No discharge.     Conjunctiva/sclera: Conjunctivae normal.     Pupils: Pupils are equal, round, and reactive to light.  Cardiovascular:     Rate and Rhythm: Normal rate and regular rhythm.     Pulses: Normal pulses.     Heart sounds: Normal heart sounds. No murmur heard.   No friction rub. No gallop.  Pulmonary:     Effort: Pulmonary effort is normal.     Breath sounds: Normal breath sounds. No stridor. No wheezing, rhonchi or rales.  Musculoskeletal:     Cervical back: Normal range of motion and neck supple. No rigidity or tenderness.  Lymphadenopathy:     Cervical: No cervical adenopathy.  Skin:    General: Skin is warm and dry.     Capillary Refill: Capillary refill takes less than 2 seconds.  Neurological:     General: No focal deficit present.     Mental Status: She is alert and oriented to person, place, and time.     UC Treatments / Results  Labs (all labs ordered are listed, but only abnormal results are displayed) Labs Reviewed - No data to display  EKG   Radiology No results found.  Procedures Procedures (including critical care time)  Medications Ordered in UC Medications - No data to display  Initial Impression / Assessment and Plan / UC Course  I have reviewed the triage vital signs and the nursing notes.  Pertinent labs & imaging results that were available during my care of the patient were reviewed by me and considered in my medical decision making (see chart for details).  Clinical impression: 1.  Viral upper respiratory tract infection 2.   Laryngitis 3.  Cough 4.  Hoarseness of her voice.  Treatment plan: 1.  The findings and treatment plan were discussed in detail with the patient.  Patient was in agreement. 2.  I had a long discussion with her that the origin of her laryngitis is viral and that there is not an antibiotic that can be used.  She voiced verbal understanding. 3.  I did give her a medicine for her cough she can get that at the pharmacy. 4.  Educational handouts provided. 5.  Supportive care, over-the-counter meds as needed, salt water gargles, throat lozenges as needed. 6.  Plenty of rest and plenty fluids. 7.  If symptoms persist she should see her PCP. 8.  If symptoms worsen she should go to the ER. 9.  She was discharged in stable condition and will follow-up here as needed.    Final Clinical Impressions(s) / UC Diagnoses   Final diagnoses:  Laryngitis, acute  Cough  Viral upper respiratory tract infection  Hoarseness of voice     Discharge Instructions      As we discussed, the voice issues are viral in nature.  No antibiotic needed at this time. I did send in a prescription for your cough. Supportive care, over-the-counter meds as needed, salt water gargles, Throat lozenges as needed. Please see educational handouts Plenty of rest, plenty of fluids. If your symptoms persist please see your primary care physician. If your symptoms worsen then please go to the ER.  ED Prescriptions     Medication Sig Dispense Auth. Provider   promethazine-dextromethorphan (PROMETHAZINE-DM) 6.25-15 MG/5ML syrup Take 5 mLs by mouth 4 (four) times daily as needed for cough. 180 mL Verda Cumins, MD      PDMP not reviewed this encounter.   Verda Cumins, MD 06/06/21 [phone]

## 2021-06-06 NOTE — Discharge Instructions (Addendum)
As we discussed, the voice issues are viral in nature.  No antibiotic needed at this time. I did send in a prescription for your cough. Supportive care, over-the-counter meds as needed, salt water gargles, Throat lozenges as needed. Please see educational handouts Plenty of rest, plenty of fluids. If your symptoms persist please see your primary care physician. If your symptoms worsen then please go to the ER.

## 2021-06-06 NOTE — ED Triage Notes (Addendum)
Pt c/o cough and hoarseness since Friday. Pt denies nasal congestion or sore throat, f/n/v/d or other symptoms. Pt took an at-home COVID test today and it was negative.

## 2021-11-14 IMAGING — CR DG HAND COMPLETE 3+V*R*
3 series · 3 of 3 positions shown · non-contrast
Comparison: None.

CLINICAL DATA: Right hand injury

EXAM:
RIGHT HAND - COMPLETE 3+ VIEW

[hand ap]
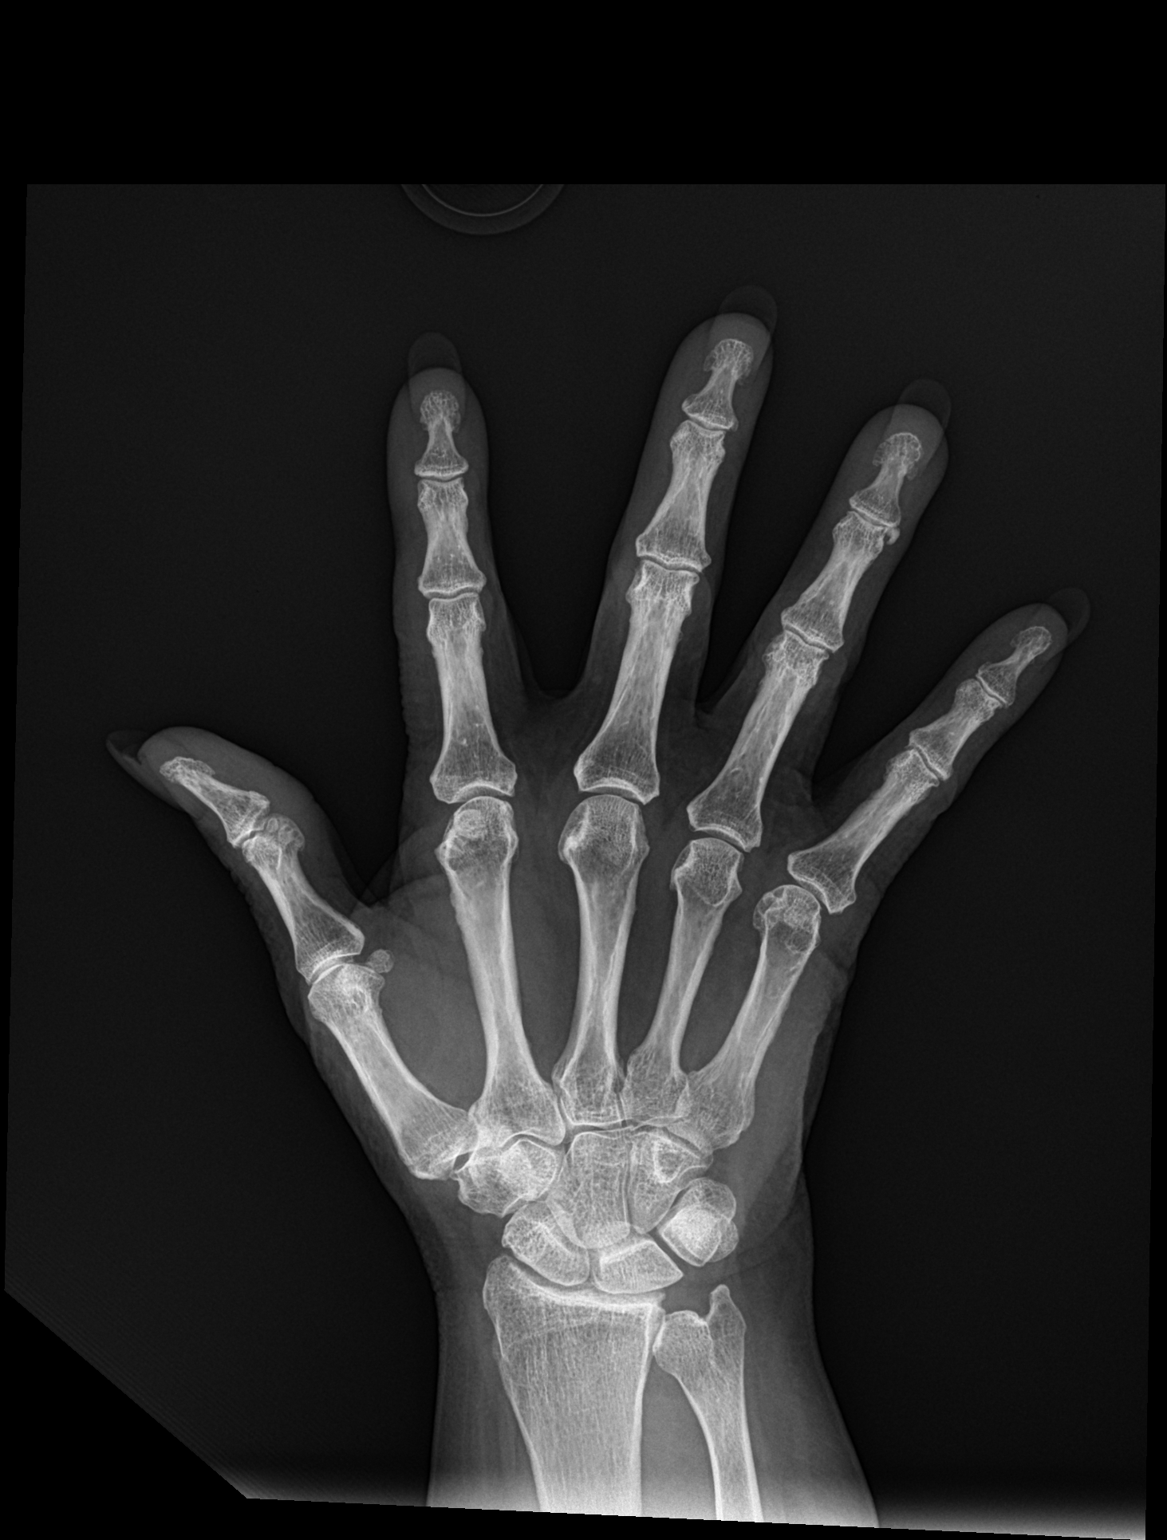

[hand obl]
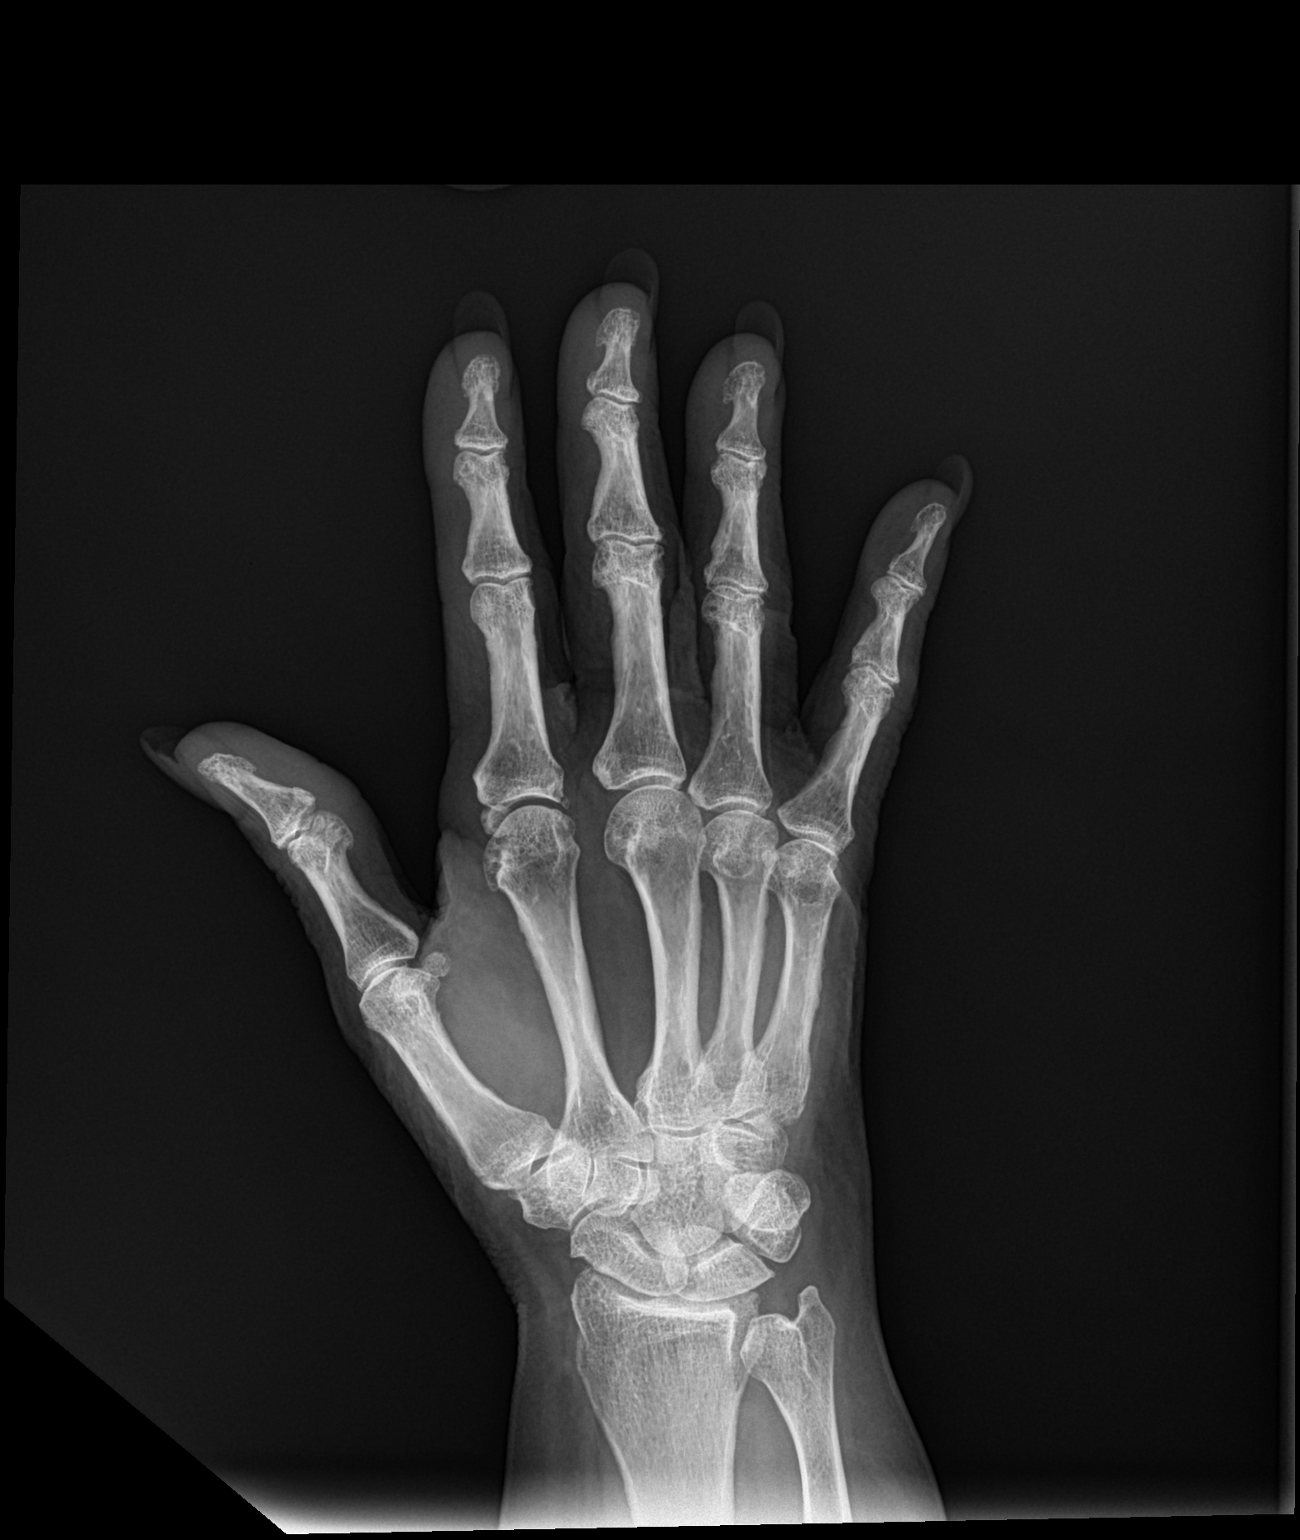

[hand lat]
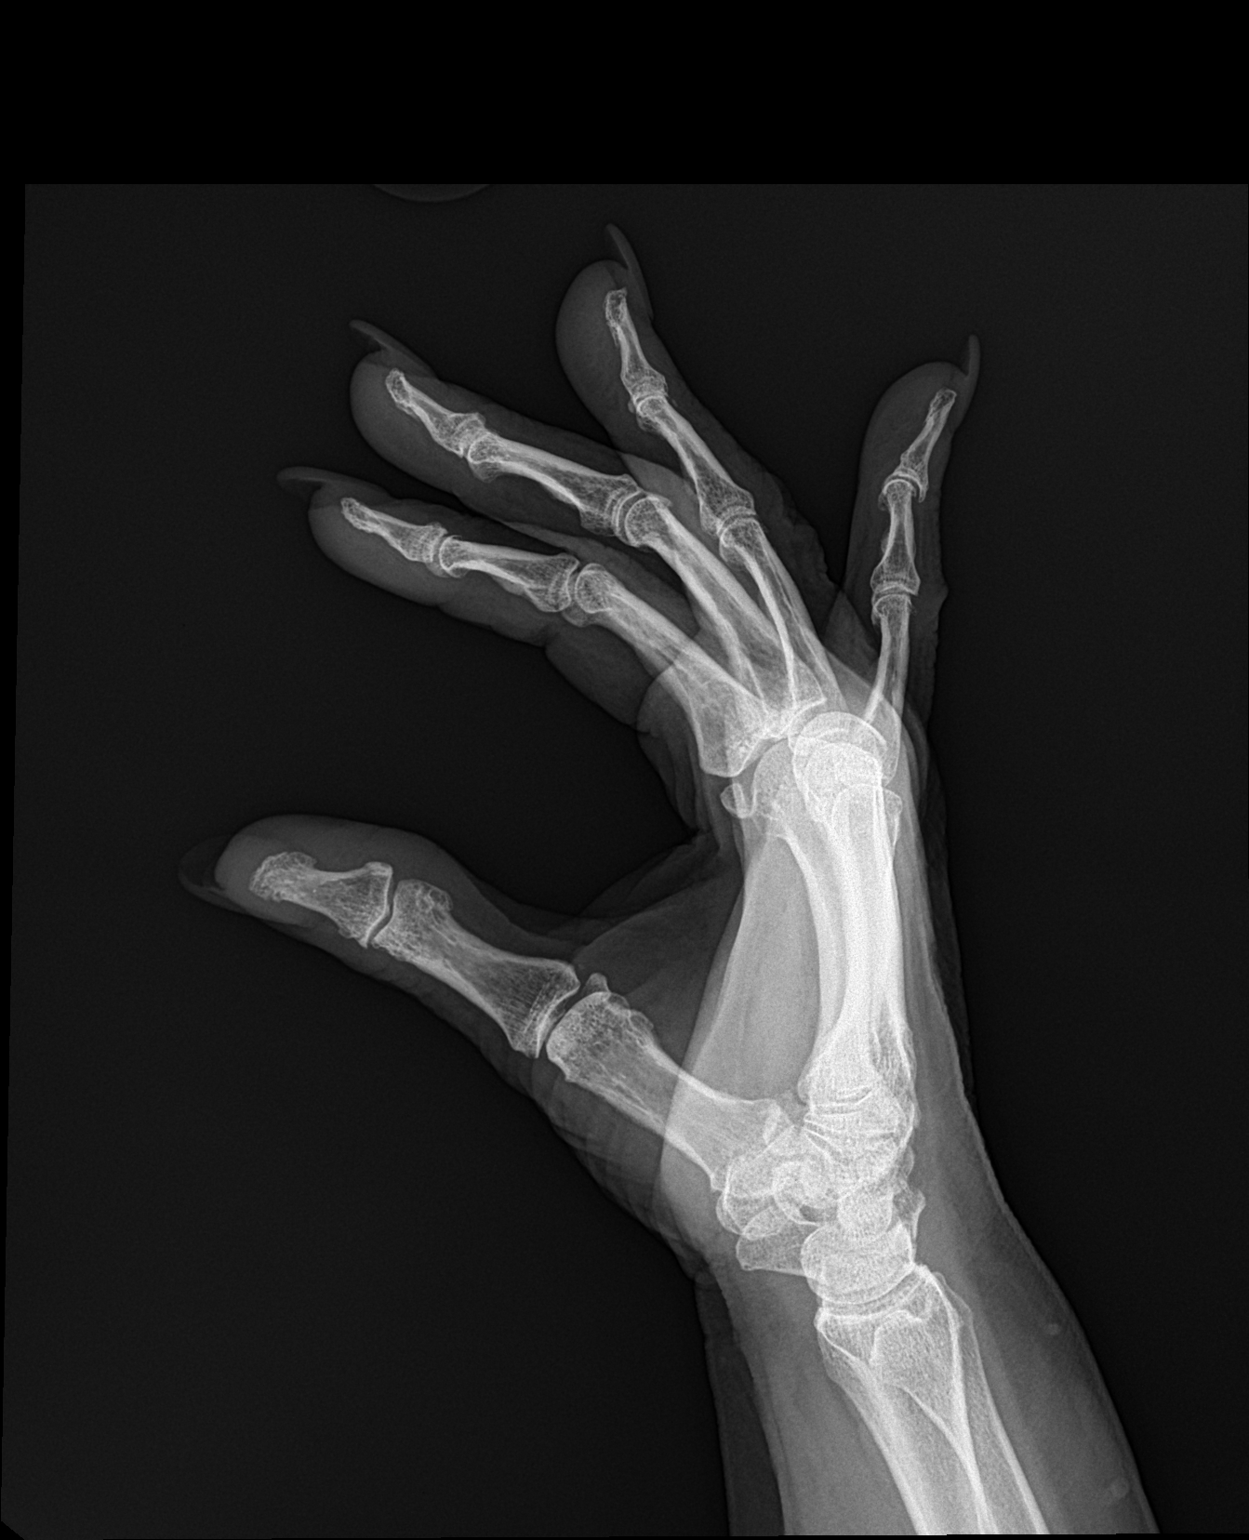

[3 of 3 positions shown; findings below may reference images not displayed]

FINDINGS: No acute displaced fracture or malalignment. No radiopaque foreign
body. Arthritis at the D IP joints and PIP joints.
IMPRESSION: No acute osseous abnormality

## 2022-04-26 ENCOUNTER — Other Ambulatory Visit: Payer: Medicare Other

## 2022-04-26 ENCOUNTER — Other Ambulatory Visit: Payer: Self-pay | Admitting: Infectious Diseases

## 2022-04-26 DIAGNOSIS — R42 Dizziness and giddiness: Secondary | ICD-10-CM

## 2022-04-26 DIAGNOSIS — E7800 Pure hypercholesterolemia, unspecified: Secondary | ICD-10-CM

## 2022-04-26 DIAGNOSIS — Z780 Asymptomatic menopausal state: Secondary | ICD-10-CM

## 2022-04-26 DIAGNOSIS — I159 Secondary hypertension, unspecified: Secondary | ICD-10-CM

## 2022-04-30 ENCOUNTER — Ambulatory Visit: Payer: Medicare Other | Attending: Infectious Diseases

## 2022-04-30 DIAGNOSIS — R42 Dizziness and giddiness: Secondary | ICD-10-CM | POA: Insufficient documentation

## 2023-01-01 ENCOUNTER — Other Ambulatory Visit (INDEPENDENT_AMBULATORY_CARE_PROVIDER_SITE_OTHER): Payer: Self-pay | Admitting: Nurse Practitioner

## 2023-01-01 DIAGNOSIS — I83813 Varicose veins of bilateral lower extremities with pain: Secondary | ICD-10-CM

## 2023-01-13 ENCOUNTER — Ambulatory Visit (INDEPENDENT_AMBULATORY_CARE_PROVIDER_SITE_OTHER): Payer: 59

## 2023-01-13 ENCOUNTER — Ambulatory Visit (INDEPENDENT_AMBULATORY_CARE_PROVIDER_SITE_OTHER): Payer: 59 | Admitting: Vascular Surgery

## 2023-01-13 ENCOUNTER — Encounter (INDEPENDENT_AMBULATORY_CARE_PROVIDER_SITE_OTHER): Payer: Self-pay | Admitting: Vascular Surgery

## 2023-01-13 VITALS — BP 134/76 | HR 67 | Resp 18 | Ht 60.0 in | Wt 170.8 lb

## 2023-01-13 DIAGNOSIS — I83813 Varicose veins of bilateral lower extremities with pain: Secondary | ICD-10-CM | POA: Diagnosis not present

## 2023-01-13 DIAGNOSIS — I10 Essential (primary) hypertension: Secondary | ICD-10-CM | POA: Diagnosis not present

## 2023-01-13 DIAGNOSIS — I8310 Varicose veins of unspecified lower extremity with inflammation: Secondary | ICD-10-CM | POA: Diagnosis not present

## 2023-01-13 NOTE — Progress Notes (Signed)
MRN : [id number]  IRHA Anne Castillo is a 77 y.o. (02/08/1946) female who presents with chief complaint of varicose veins hurt.  History of Present Illness: ***  No outpatient medications have been marked as taking for the 01/13/23 encounter (Appointment) with Gilda Crease, Latina Craver, MD.    Past Medical History:  Diagnosis Date   Anxiety    Hypertension    Pain    sharp epigastric pain "stress"   Wears dentures    partial upper    Past Surgical History:  Procedure Laterality Date   BROW LIFT Bilateral 10/05/2020   Procedure: BLEPHAROPLASTY UPPER EYELID; W/EXCESS SKIN BROW PTOSIS REPAIR BILATERAL;  Surgeon: Imagene Riches, MD;  Location: [facility];  Service: Ophthalmology;  Laterality: Bilateral;   CHOLECYSTECTOMY N/A 03/29/2015   Procedure: LAPAROSCOPIC CHOLECYSTECTOMY;  Surgeon: Ida Rogue, MD;  Location: [facility];  Service: General;  Laterality: N/A;   HERNIA REPAIR  03/25/2016   Ventral hernia repair with 6.4 cm Ventrio ST mesh   MASS EXCISION Right 02/28/2017   Procedure: EXCISION oral lesion;  Surgeon: Vernie Murders, MD;  Location: [facility];  Service: ENT;  Laterality: Right;   VENTRAL HERNIA REPAIR N/A 03/25/2016   Procedure: HERNIA REPAIR VENTRAL ADULT;  Surgeon: Earline Mayotte, MD;  Location: [facility];  Service: General;  Laterality: N/A;    Social History Social History   Tobacco Use   Smoking status: Never   Smokeless tobacco: Never  Vaping Use   Vaping Use: Never used  Substance Use Topics   Alcohol use: No   Drug use: No    Family History Family History  Problem Relation Age of Onset   Kidney cancer Mother     Allergies  Allergen Reactions   Gadolinium Derivatives Hives    Pt received Dotarem with a 13 hr steroid prep for todays scan (10/18/18) without issue. Multiple Hives on face and chest. Redness arms bilaterally with Multihance Pt received Dotarem with a 13 hr steroid prep for todays scan (10/18/18) without  issue. Multiple Hives on face and chest. Redness arms bilaterally with Multihance      REVIEW OF SYSTEMS (Negative unless checked)  Constitutional: [] Weight loss  [] Fever  [] Chills Cardiac: [] Chest pain   [] Chest pressure   [] Palpitations   [] Shortness of breath when laying flat   [] Shortness of breath with exertion. Vascular:  [] Pain in legs with walking   [x] Pain in legs with standing  [] History of DVT   [] Phlebitis   [] Swelling in legs   [x] Varicose veins   [] Non-healing ulcers Pulmonary:   [] Uses home oxygen   [] Productive cough   [] Hemoptysis   [] Wheeze  [] COPD   [] Asthma Neurologic:  [] Dizziness   [] Seizures   [] History of stroke   [] History of TIA  [] Aphasia   [] Vissual changes   [] Weakness or numbness in arm   [] Weakness or numbness in leg Musculoskeletal:   [] Joint swelling   [] Joint pain   [] Low back pain Hematologic:  [] Easy bruising  [] Easy bleeding   [] Hypercoagulable state   [] Anemic Gastrointestinal:  [] Diarrhea   [] Vomiting  [] Gastroesophageal reflux/heartburn   [] Difficulty swallowing. Genitourinary:  [] Chronic kidney disease   [] Difficult urination  [] Frequent urination   [] Blood in urine Skin:  [] Rashes   [] Ulcers  Psychological:  [] History of anxiety   []  History of major depression.  Physical Examination  There were no vitals filed for this visit. There is no height or weight on file to calculate BMI. Gen: WD/WN,  NAD Head: Ray/AT, No temporalis wasting.  Ear/Nose/Throat: Hearing grossly intact, nares w/o erythema or drainage, pinna without lesions Eyes: PER, EOMI, sclera nonicteric.  Neck: Supple, no gross masses.  No JVD.  Pulmonary:  Good air movement, no audible wheezing, no use of accessory muscles.  Cardiac: RRR, precordium not hyperdynamic. Vascular:  Large varicosities present, greater than 10 mm ***.  Veins are tender to palpation  Mild venous stasis changes to the legs bilaterally.  Trace soft pitting edema CEAP [id number] Vessel Right Left  Radial  Palpable Palpable  Gastrointestinal: soft, non-distended. No guarding/no peritoneal signs.  Musculoskeletal: M/S 5/5 throughout.  No deformity.  Neurologic: CN 2-12 intact. Pain and light touch intact in extremities.  Symmetrical.  Speech is fluent. Motor exam as listed above. Psychiatric: Judgment intact, Mood & affect appropriate for pt's clinical situation. Dermatologic: Venous rashes no ulcers noted.  No changes consistent with cellulitis. Lymph : No lichenification or skin changes of chronic lymphedema.  CBC Lab Results  Component Value Date   WBC 6.5 11/27/2015   HGB 14.7 11/27/2015   HCT 44.1 11/27/2015   MCV 83.7 11/27/2015   PLT 225 11/27/2015    BMET    Component Value Date/Time   NA 140 12/21/2015 0754   NA 139 10/04/2012 1728   K 4.6 12/21/2015 0754   K 2.7 (L) 10/04/2012 1728   CL 101 12/21/2015 0754   CL 102 10/04/2012 1728   CO2 25 12/21/2015 0754   CO2 28 10/04/2012 1728   GLUCOSE 106 (H) 12/21/2015 0754   GLUCOSE 103 (H) 11/27/2015 1436   GLUCOSE 95 10/04/2012 1728   BUN 15 12/21/2015 0754   BUN 12 10/04/2012 1728   CREATININE 0.57 12/21/2015 0754   CREATININE 0.59 (L) 10/04/2012 1728   CALCIUM 9.3 12/21/2015 0754   CALCIUM 8.7 10/04/2012 1728   GFRNONAA 96 12/21/2015 0754   GFRNONAA >60 10/04/2012 1728   GFRAA 110 12/21/2015 0754   GFRAA >60 10/04/2012 1728   CrCl cannot be calculated (Patient's most recent lab result is older than the maximum 21 days allowed.).  COAG No results found for: "INR", "PROTIME"  Radiology No results found.   Assessment/Plan There are no diagnoses linked to this encounter.   Levora Dredge, MD  01/13/2023 1:27 PM

## 2023-01-14 ENCOUNTER — Encounter (INDEPENDENT_AMBULATORY_CARE_PROVIDER_SITE_OTHER): Payer: Self-pay | Admitting: Vascular Surgery

## 2023-01-14 DIAGNOSIS — I8310 Varicose veins of unspecified lower extremity with inflammation: Secondary | ICD-10-CM | POA: Insufficient documentation

## 2023-04-08 ENCOUNTER — Encounter (INDEPENDENT_AMBULATORY_CARE_PROVIDER_SITE_OTHER): Payer: Self-pay | Admitting: Nurse Practitioner

## 2023-04-08 ENCOUNTER — Ambulatory Visit (INDEPENDENT_AMBULATORY_CARE_PROVIDER_SITE_OTHER): Payer: 59 | Admitting: Nurse Practitioner

## 2023-04-08 VITALS — BP 171/79 | HR 69 | Resp 16 | Wt 173.0 lb

## 2023-04-08 DIAGNOSIS — I8310 Varicose veins of unspecified lower extremity with inflammation: Secondary | ICD-10-CM

## 2023-04-08 DIAGNOSIS — I8311 Varicose veins of right lower extremity with inflammation: Secondary | ICD-10-CM

## 2023-04-08 DIAGNOSIS — I8312 Varicose veins of left lower extremity with inflammation: Secondary | ICD-10-CM

## 2023-04-09 ENCOUNTER — Encounter (INDEPENDENT_AMBULATORY_CARE_PROVIDER_SITE_OTHER): Payer: Self-pay | Admitting: Nurse Practitioner

## 2023-04-09 NOTE — Progress Notes (Signed)
Varicose veins of bilateral  lower extremity with inflammation (454.1  I83.10) Current Plans   Indication: Patient presents with symptomatic varicose veins of the bilateral  lower extremity.   Procedure: Sclerotherapy using hypertonic saline mixed with 1% Lidocaine was performed on the bilateral lower extremity. Compression wraps were placed. The patient tolerated the procedure well. 

## 2023-05-04 ENCOUNTER — Other Ambulatory Visit: Payer: Self-pay

## 2023-05-04 ENCOUNTER — Emergency Department: Payer: 59

## 2023-05-04 ENCOUNTER — Emergency Department
Admission: EM | Admit: 2023-05-04 | Discharge: 2023-05-04 | Disposition: A | Discharge status: Home / Self Care | Payer: 59 | Source: Home / Self Care | Attending: Emergency Medicine | Admitting: Emergency Medicine

## 2023-05-04 DIAGNOSIS — R42 Dizziness and giddiness: Secondary | ICD-10-CM | POA: Diagnosis present

## 2023-05-04 DIAGNOSIS — I10 Essential (primary) hypertension: Secondary | ICD-10-CM | POA: Diagnosis not present

## 2023-05-04 DIAGNOSIS — H81399 Other peripheral vertigo, unspecified ear: Secondary | ICD-10-CM

## 2023-05-04 LAB — CBC
HCT: 42.3 % (ref 36.0–46.0)
Hemoglobin: 13.9 g/dL (ref 12.0–15.0)
MCH: 28.1 pg (ref 26.0–34.0)
MCHC: 32.9 g/dL (ref 30.0–36.0)
MCV: 85.6 fL (ref 80.0–100.0)
Platelets: 232 10*3/uL (ref 150–400)
RBC: 4.94 MIL/uL (ref 3.87–5.11)
RDW: 13.7 % (ref 11.5–15.5)
WBC: 5 10*3/uL (ref 4.0–10.5)
nRBC: 0 % (ref 0.0–0.2)

## 2023-05-04 LAB — CBG MONITORING, ED: Glucose-Capillary: 77 mg/dL (ref 70–99)

## 2023-05-04 LAB — URINALYSIS, ROUTINE W REFLEX MICROSCOPIC
Bilirubin Urine: NEGATIVE
Glucose, UA: NEGATIVE mg/dL
Hgb urine dipstick: NEGATIVE
Ketones, ur: NEGATIVE mg/dL
Leukocytes,Ua: NEGATIVE
Nitrite: NEGATIVE
Protein, ur: NEGATIVE mg/dL
Specific Gravity, Urine: 1.012 (ref 1.005–1.030)
pH: 6 (ref 5.0–8.0)

## 2023-05-04 LAB — BASIC METABOLIC PANEL
Anion gap: 11 (ref 5–15)
BUN: 25 mg/dL — ABNORMAL HIGH (ref 8–23)
CO2: 23 mmol/L (ref 22–32)
Calcium: 9.1 mg/dL (ref 8.9–10.3)
Chloride: 104 mmol/L (ref 98–111)
Creatinine, Ser: 0.82 mg/dL (ref 0.44–1.00)
GFR, Estimated: >60 mL/min (ref >60)
Glucose, Bld: 90 mg/dL (ref 70–99)
Potassium: 3.7 mmol/L (ref 3.5–5.1)
Sodium: 138 mmol/L (ref 135–145)

## 2023-05-04 MED ORDER — MECLIZINE HCL 25 MG PO TABS
12.5000 mg | ORAL_TABLET | Freq: Once | ORAL | Status: AC
  Administered 2023-05-04: 12.5 mg via ORAL
  Filled 2023-05-04: qty 1

## 2023-05-04 MED ORDER — MECLIZINE HCL 12.5 MG PO TABS: 12.5000 mg | ORAL_TABLET | Freq: Three times a day (TID) | ORAL | 0 refills | Status: AC | PRN

## 2023-05-04 NOTE — ED Triage Notes (Signed)
Pt to ED for dizziness since this morning. States "my whole body feels weird". Pt denies SOB, CP, NVD. No arm drift, face symmetrical.

## 2023-05-04 NOTE — ED Provider Notes (Signed)
[facility] Provider Note    Event Date/Time   First MD Initiated Contact with Patient 05/04/23 1507     (approximate)   History   Dizziness  Discussed with patient, recommended use of Spanish interpreter (already cart in room too).  Patient declined use of Spanish interpreter.  She does however seem to have a good grasp of English and understood that she is to ask if any questions and happy to provide interpreter.  HPI  Anne Castillo is a 77 y.o. female who has a history of hypertension and anxiety   Reviewed most recent note from vascular surgery from April patient has a history of varicose veins, no history of PE or DVT.  Patient noticed this morning when she got up soon as she got up she felt like a spinning feeling.  She is noticed when she turns her head or gets up and down she gets a spinning feeling.  No speech changes no headache no numbness no weakness in arm or leg no facial weakness or other symptoms. She feels like she had to be very cautious and sort of balance herself throughout the day.  No falls or injury  No chest pain no shortness of breath no recent illness.  She relates that she feels like she had something like this once before, but it went away  Physical Exam   Triage Vital Signs: ED Triage Vitals  Encounter Vitals Group     BP 05/04/23 1420 (!) 113/47     Systolic BP Percentile --      Diastolic BP Percentile --      Pulse Rate 05/04/23 1420 65     Resp 05/04/23 1420 16     Temp 05/04/23 1420 98.4 F (36.9 C)     Temp Source 05/04/23 1420 Oral     SpO2 05/04/23 1420 100 %     Weight 05/04/23 1419 176 lb (79.8 kg)     Height 05/04/23 1419 5' (1.524 m)     Head Circumference --      Peak Flow --      Pain Score 05/04/23 1416 6     Pain Loc --      Pain Education --      Exclude from Growth Chart --     Most recent vital signs: Vitals:   05/04/23 1645 05/04/23 1715  BP:    Pulse: 65 65  Resp: (!) 24 12  Temp:     SpO2: 100% 100%     General: Awake, no distress.  Very pleasant accompanied by husband  Holds her head fairly still.  Extraocular movements normal.  No nystagmus.  No pronator drift in extremity.  Normal cranial nerve exam.  Normal sensation arms legs and face bilaterally.  Normal strength.  No ataxia.  Tympanic membranes normal bilaterally.  Patient reports if she turns her head quickly she will start to feel dizzy   CV:  Good peripheral perfusion.  Normal tones and rate Resp:  Normal effort.  Clear bilateral Abd:  No distention.  Other:  Fully alert well-oriented.  Moist mucous membranes   ED Results / Procedures / Treatments   Labs (all labs ordered are listed, but only abnormal results are displayed) Labs Reviewed  BASIC METABOLIC PANEL - Abnormal; Notable for the following components:      Result Value   BUN 25 (*)    All other components within normal limits  URINALYSIS, ROUTINE W REFLEX MICROSCOPIC - Abnormal; Notable  for the following components:   Color, Urine STRAW (*)    APPearance CLEAR (*)    All other components within normal limits  CBC  CBG MONITORING, ED     EKG  And interpreted by me at 1455 heart rate 60 QRS 70 QTc 430 Normal sinus rhythm no evidence of ischemia.  Reassuring ECG   RADIOLOGY  CT head interpreted by me as grossly negative for acute pathology   PROCEDURES:  Critical Care performed: No  Procedures   MEDICATIONS ORDERED IN ED: Medications  meclizine (ANTIVERT) tablet 12.5 mg (12.5 mg Oral Given 05/04/23 1623)     IMPRESSION / MDM / ASSESSMENT AND PLAN / ED COURSE  I reviewed the triage vital signs and the nursing notes.                              Differential diagnosis includes, but is not limited to, peripheral vertigo, BPV, less likely central cause.  She describes clear symptoms of vertigo like symptomatology exacerbated by head movement sitting standing turning head side-to-side.  At present her neurologic exam is  normal with no evidence of stroke or obvious central process.  Given dose of meclizine, reports improvement.  Resting comfortably.  Labs including CBC metabolic panel within normal limits.  Urinalysis normal  No acute vascular cardiac or pulmonary symptoms no infectious symptomatology.  Vital signs normal  Patient's presentation is most consistent with acute complicated illness / injury requiring diagnostic workup.  ----------------------------------------- 6:14 PM on 05/04/2023 ----------------------------------------- Evaluation most consistent with vertigo likely peripheral source.  Will prescribe meclizine, discussed and recommended patient not drive and strongly recommended she establish follow-up with [facility] and consideration for further evaluation there and possible physical therapy.  Her clinical examination is most consistent with vertigo peripheral nature at this time  Return precautions and treatment recommendations and follow-up discussed with the patient who is agreeable with the plan.        FINAL CLINICAL IMPRESSION(S) / ED DIAGNOSES   Final diagnoses:  Peripheral vertigo, unspecified laterality     Rx / DC Orders   ED Discharge Orders          Ordered    meclizine (ANTIVERT) 12.5 MG tablet  3 times daily PRN        05/04/23 1759             Note:  This document was prepared using Dragon voice recognition software and may include unintentional dictation errors.   Sharyn Creamer, MD 05/04/23 [phone]

## 2023-05-06 ENCOUNTER — Ambulatory Visit (INDEPENDENT_AMBULATORY_CARE_PROVIDER_SITE_OTHER): Payer: 59 | Admitting: Nurse Practitioner

## 2023-05-13 ENCOUNTER — Encounter: Payer: Self-pay | Admitting: Dietician

## 2023-05-13 ENCOUNTER — Encounter: Payer: 59 | Attending: Infectious Diseases | Admitting: Dietician

## 2023-05-13 VITALS — Ht 60.0 in | Wt 177.0 lb

## 2023-05-13 DIAGNOSIS — R7303 Prediabetes: Secondary | ICD-10-CM | POA: Insufficient documentation

## 2023-05-13 DIAGNOSIS — R634 Abnormal weight loss: Secondary | ICD-10-CM | POA: Insufficient documentation

## 2023-05-13 DIAGNOSIS — I10 Essential (primary) hypertension: Secondary | ICD-10-CM | POA: Insufficient documentation

## 2023-05-13 DIAGNOSIS — Z6834 Body mass index (BMI) 34.0-34.9, adult: Secondary | ICD-10-CM | POA: Insufficient documentation

## 2023-05-13 DIAGNOSIS — E669 Obesity, unspecified: Secondary | ICD-10-CM | POA: Diagnosis not present

## 2023-05-13 DIAGNOSIS — Z713 Dietary counseling and surveillance: Secondary | ICD-10-CM | POA: Diagnosis not present

## 2023-05-13 DIAGNOSIS — F32A Depression, unspecified: Secondary | ICD-10-CM | POA: Insufficient documentation

## 2023-05-13 NOTE — Patient Instructions (Addendum)
Increase your water intake by one more of your cups (16oz) per day.  Switch back to Splenda in your coffee instead of regular sugar.  When having eggs for breakfast, only have 1 egg yolk.  Use your arm chair exercises handout to choose options to increase your exercise! Try to do 10 - 15 minutes  When having steak, be sure to cut as much extra fat off of the steak as possible!  Try to remove the skin from your chicken wings when having them!

## 2023-05-13 NOTE — Progress Notes (Signed)
Medical Nutrition Therapy  Appointment Start time:  76  Appointment End time:  1155  Primary concerns today: Weight Loss/Leg pain Referral diagnosis: R73.03 - Prediabetes, E66.9 - Obesity Preferred learning style: No preference indicated) Learning readiness: Ready   NUTRITION ASSESSMENT   Anthropometrics  Ht: 5' Wt: 177.0 lbs BMI: 34.57 kg/m2  Clinical Medical Hx: HTN, Prediabetes, Depression Medications: Hydrochlorothiazide, Losartan,  Labs: A1c - 5.9%,  BUN - 25 (high),  Notable Signs/Symptoms: Walks tenderly   Lifestyle & Dietary Hx Pt reports wanting to lose weight, states they believe it will help with pain in their legs. Pt reports significant leg pain, has been getting shots that are helping the pain. Pt reports having a cruise planned with their family in a couple weeks and wants their legs to begin to feel better by then. Pt reports having gym membership but hasn't gone since leg pain started. Pt reports losing son 2 years ago, went through a year-long period of depression but has been improving over the last year. Pt states grief is still heavy but has wonderful family support, great husband, lots of encouragement from children. Pt reports large family in [place] (10 grandchildren and 7 great grandchildren) that live close by. 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 Pt reports doing a lot of driving when there. Pt reports usually missing lunch when working. Pt reports cutting back on rice, pasta, breads, beans, and fruit since last PCP visit to losew weight. Eating more salads, higher protein breakfast.   Estimated daily fluid intake: 48 oz Supplements: Vit D Sleep: Difficult at times, pain can keep pt awake, melatonin helps Stress / self-care: Moderate Current average weekly physical activity: Very limited d/t leg pain   24-Hr Dietary Recall First Meal: Coffee, 2 eggs, 1 slice toast, 1 slice bacon, salsa Snack:  none Second Meal: No lunch Snack: none Third Meal: Steak, tomato/onion salad Snack: Hot tea,  Beverages: Coffee, hot tea, water    NUTRITION DIAGNOSIS  NB-1.1 Food and nutrition-related knowledge deficit As related to prediabetes.  As evidenced by A1c of 5.9%, hx of depression/high stress.   NUTRITION INTERVENTION  Nutrition education (E-1) on the following topics:  Educated patient on the two components of energy balance: Energy in (calories), and energy out (activity). Explain the role of negative energy balance in weight loss. Discussed options with patient to achieve a negative energy balance and how to best control energy in and energy out to accommodate their lifestyle. Educated patient on various seated exercise options to increase physical activity without exacerbating leg pain.   Handouts Provided Include  Arm Chair Exercises  Learning Style & Readiness for Change Teaching method utilized: Visual & Auditory  Demonstrated degree of understanding via: Teach Back  Barriers to learning/adherence to lifestyle change: None  Goals Established by Pt Increase your water intake by one more of your cups (16oz) per day. Switch back to Splenda in your coffee instead of regular sugar. When having eggs for breakfast, only have 1 egg yolk. Use your arm chair exercises handout to choose options to increase your exercise! Try to do 10 - 15 minutes When having steak, be sure to cut as much extra fat off of the steak as possible! Try to remove the skin from your chicken wings when having them!   MONITORING & EVALUATION Dietary intake, weekly physical activity, and weight loss in 2 months.  Next Steps  Patient is to follow up  with RD.

## 2023-06-03 ENCOUNTER — Encounter (INDEPENDENT_AMBULATORY_CARE_PROVIDER_SITE_OTHER): Payer: Self-pay | Admitting: Nurse Practitioner

## 2023-06-03 ENCOUNTER — Ambulatory Visit (INDEPENDENT_AMBULATORY_CARE_PROVIDER_SITE_OTHER): Payer: 59 | Admitting: Nurse Practitioner

## 2023-06-03 VITALS — BP 128/66 | HR 66 | Resp 16 | Wt 175.8 lb

## 2023-06-03 DIAGNOSIS — I8310 Varicose veins of unspecified lower extremity with inflammation: Secondary | ICD-10-CM

## 2023-06-03 NOTE — Progress Notes (Signed)
Today the patient presented for sclerotherapy to bilateral lower extremities but before proceeding she noted that she was having some pain along the lateral portion of her left leg and is area that had not right below her knee large scar on her right.  Point-of-care ultrasound showed that these areas have superficial phlebitis presents which could be the cause of inflammation or tenderness and the patient is experiencing his areas.  We have given the patient Eliquis 2.5 mg take twice daily for 1 month.  She is advised to transition to Eliquis for 1 month and then we will reevaluate her noninvasive studies.  No sclerotherapy done today.

## 2023-07-14 ENCOUNTER — Ambulatory Visit: Payer: 59 | Admitting: Dietician

## 2023-07-31 ENCOUNTER — Other Ambulatory Visit (INDEPENDENT_AMBULATORY_CARE_PROVIDER_SITE_OTHER): Payer: Self-pay | Admitting: Nurse Practitioner

## 2023-07-31 DIAGNOSIS — I8310 Varicose veins of unspecified lower extremity with inflammation: Secondary | ICD-10-CM

## 2023-08-04 ENCOUNTER — Ambulatory Visit (INDEPENDENT_AMBULATORY_CARE_PROVIDER_SITE_OTHER): Payer: 59 | Admitting: Nurse Practitioner

## 2023-08-04 ENCOUNTER — Encounter (INDEPENDENT_AMBULATORY_CARE_PROVIDER_SITE_OTHER): Payer: 59

## 2024-01-22 ENCOUNTER — Other Ambulatory Visit: Payer: Self-pay | Admitting: Sports Medicine

## 2024-01-22 DIAGNOSIS — G8929 Other chronic pain: Secondary | ICD-10-CM

## 2024-01-22 DIAGNOSIS — M7582 Other shoulder lesions, left shoulder: Secondary | ICD-10-CM

## 2024-01-22 DIAGNOSIS — M19012 Primary osteoarthritis, left shoulder: Secondary | ICD-10-CM

## 2024-01-22 DIAGNOSIS — M7502 Adhesive capsulitis of left shoulder: Secondary | ICD-10-CM

## 2024-01-22 DIAGNOSIS — M7541 Impingement syndrome of right shoulder: Secondary | ICD-10-CM

## 2024-01-30 ENCOUNTER — Ambulatory Visit
Admission: RE | Admit: 2024-01-30 | Discharge: 2024-01-30 | Disposition: A | Discharge status: Home / Self Care | Source: Ambulatory Visit | Attending: Sports Medicine | Admitting: Sports Medicine

## 2024-01-30 DIAGNOSIS — M7502 Adhesive capsulitis of left shoulder: Secondary | ICD-10-CM | POA: Diagnosis present

## 2024-01-30 DIAGNOSIS — M25512 Pain in left shoulder: Secondary | ICD-10-CM | POA: Insufficient documentation

## 2024-01-30 DIAGNOSIS — M19012 Primary osteoarthritis, left shoulder: Secondary | ICD-10-CM | POA: Insufficient documentation

## 2024-01-30 DIAGNOSIS — M7582 Other shoulder lesions, left shoulder: Secondary | ICD-10-CM | POA: Diagnosis present

## 2024-01-30 DIAGNOSIS — M7541 Impingement syndrome of right shoulder: Secondary | ICD-10-CM | POA: Diagnosis present

## 2024-01-30 DIAGNOSIS — G8929 Other chronic pain: Secondary | ICD-10-CM | POA: Diagnosis present

## 2024-02-24 ENCOUNTER — Encounter (INDEPENDENT_AMBULATORY_CARE_PROVIDER_SITE_OTHER): Payer: Self-pay

## 2024-06-17 ENCOUNTER — Emergency Department

## 2024-06-17 ENCOUNTER — Other Ambulatory Visit: Payer: Self-pay

## 2024-06-17 ENCOUNTER — Emergency Department
Admission: EM | Admit: 2024-06-17 | Discharge: 2024-06-17 | Disposition: A | Discharge status: Home / Self Care | Attending: Emergency Medicine | Admitting: Emergency Medicine

## 2024-06-17 DIAGNOSIS — R42 Dizziness and giddiness: Secondary | ICD-10-CM | POA: Diagnosis present

## 2024-06-17 DIAGNOSIS — H538 Other visual disturbances: Secondary | ICD-10-CM | POA: Diagnosis not present

## 2024-06-17 DIAGNOSIS — Z7901 Long term (current) use of anticoagulants: Secondary | ICD-10-CM | POA: Insufficient documentation

## 2024-06-17 DIAGNOSIS — I10 Essential (primary) hypertension: Secondary | ICD-10-CM | POA: Insufficient documentation

## 2024-06-17 LAB — PROTIME-INR
INR: 1 (ref 0.8–1.2)
Prothrombin Time: 13.4 s (ref 11.4–15.2)

## 2024-06-17 LAB — CBC
HCT: 43.4 % (ref 36.0–46.0)
Hemoglobin: 14 g/dL (ref 12.0–15.0)
MCH: 28.1 pg (ref 26.0–34.0)
MCHC: 32.3 g/dL (ref 30.0–36.0)
MCV: 87 fL (ref 80.0–100.0)
Platelets: 218 K/uL (ref 150–400)
RBC: 4.99 MIL/uL (ref 3.87–5.11)
RDW: 14.2 % (ref 11.5–15.5)
WBC: 5 K/uL (ref 4.0–10.5)
nRBC: 0 % (ref 0.0–0.2)

## 2024-06-17 LAB — BASIC METABOLIC PANEL WITH GFR
Anion gap: 12 (ref 5–15)
BUN: 22 mg/dL (ref 8–23)
CO2: 25 mmol/L (ref 22–32)
Calcium: 9.1 mg/dL (ref 8.9–10.3)
Chloride: 103 mmol/L (ref 98–111)
Creatinine, Ser: 0.48 mg/dL (ref 0.44–1.00)
GFR, Estimated: >60 mL/min (ref >60)
Glucose, Bld: 89 mg/dL (ref 70–99)
Potassium: 3.7 mmol/L (ref 3.5–5.1)
Sodium: 140 mmol/L (ref 135–145)

## 2024-06-17 LAB — TROPONIN I (HIGH SENSITIVITY): Troponin I (High Sensitivity): 5 ng/L

## 2024-06-17 MED ORDER — ACETAMINOPHEN 500 MG PO TABS
1000.0000 mg | ORAL_TABLET | Freq: Once | ORAL | Status: AC
Start: 2024-06-17 — End: 2024-06-17
  Administered 2024-06-17: 1000 mg via ORAL
  Filled 2024-06-17: qty 2

## 2024-06-17 MED ORDER — LIDOCAINE 5 % EX PTCH
2.0000 | MEDICATED_PATCH | CUTANEOUS | Status: DC
  Administered 2024-06-17: 2 via TRANSDERMAL
  Filled 2024-06-17: qty 2

## 2024-06-17 MED ORDER — METHOCARBAMOL 500 MG PO TABS
500.0000 mg | ORAL_TABLET | Freq: Once | ORAL | Status: AC
  Administered 2024-06-17: 500 mg via ORAL
  Filled 2024-06-17: qty 1

## 2024-06-17 NOTE — ED Triage Notes (Signed)
 Pt comes in via pov with complaints of dizziness and blurred vision the past couple of days. Pt states that she has high blood pressure, but has not had her medications today. Pt complains of neck discomfort 5/10 since she woke up this morning. Pt is alert and oriented x4. Pt with no deficits noted at this time.

## 2024-06-17 NOTE — ED Provider Notes (Signed)
 [facility] Provider Note    Event Date/Time   First MD Initiated Contact with Patient 06/17/24 1106     (approximate)   History   Dizziness   HPI  Anne Castillo is a 78 y.o. female who presents to the ED for evaluation of Dizziness   Reviewed PCP visit from 6 days ago.  History of HTN, HLD and chronic back pain. Reportedly prescribed Eliquis from vein and vascular due to pain after a procedure, history of sclerotherapy and DVT but apparently not taking her Eliquis as it makes her nauseous.  Patient presents with her daughter for evaluation of blurry vision, floaters and intermittent dizziness this morning.  She reports symptoms starting while driving this morning with dizziness and global visual fields going black, self resolving after a few seconds.  Recurrence of the same symptoms a few minutes later, again self resolving.   She reports floaters in her visual fields has only residual symptom.  Reports more chronic left leg pain.  Reports getting sclerotherapy remotely in the past due to pain and varicose veins.  Reports DVT after this and that she does not take a blood thinner.  She reports chronic left leg pain reminiscent of when she was told she had a clot in her leg  Denies chest pain or shortness of breath.  Physical Exam   Triage Vital Signs: ED Triage Vitals  Encounter Vitals Group     BP 06/17/24 1013 (!) 150/71     Girls Systolic BP Percentile --      Girls Diastolic BP Percentile --      Boys Systolic BP Percentile --      Boys Diastolic BP Percentile --      Pulse Rate 06/17/24 1013 (!) 59     Resp 06/17/24 1013 17     Temp 06/17/24 1013 98.1 F (36.7 C)     Temp Source 06/17/24 1013 Oral     SpO2 06/17/24 1013 100 %     Weight 06/17/24 1014 175 lb 11.3 oz (79.7 kg)     Height 06/17/24 1014 5' (1.524 m)     Head Circumference --      Peak Flow --      Pain Score 06/17/24 1013 5     Pain Loc --      Pain Education --       Exclude from Growth Chart --     Most recent vital signs: Vitals:   06/17/24 1013  BP: (!) 150/71  Pulse: (!) 59  Resp: 17  Temp: 98.1 F (36.7 C)  SpO2: 100%    General: Awake, no distress.  CV:  Good peripheral perfusion.  Resp:  Normal effort.  Abd:  No distention.  MSK:  No deformity noted.  Neuro:  No focal deficits appreciated. Cranial nerves II through XII intact 5/5 strength and sensation in all 4 extremities Other:     ED Results / Procedures / Treatments   Labs (all labs ordered are listed, but only abnormal results are displayed) Labs Reviewed  BASIC METABOLIC PANEL WITH GFR  CBC  PROTIME-INR  TROPONIN I (HIGH SENSITIVITY)  TROPONIN I (HIGH SENSITIVITY)    EKG Sinus rhythm with a rate of 62 bpm.  Normal axis and intervals without clear signs of acute ischemia.  RADIOLOGY MRI brain interpreted by me without clear signs of acute stroke Ultrasound of the left leg interpreted by me without signs of DVT  Official radiology report(s): US  Venous Img  Lower Unilateral Left Result Date: 06/17/2024 CLINICAL DATA:  Left lower extremity pain.  History of DVT. EXAM: Left LOWER EXTREMITY VENOUS DOPPLER ULTRASOUND TECHNIQUE: Gray-scale sonography with compression, as well as color and duplex ultrasound, were performed to evaluate the deep venous system(s) from the level of the common femoral vein through the popliteal and proximal calf veins. COMPARISON:  None Available. FINDINGS: VENOUS Normal compressibility of the common femoral, superficial femoral, and popliteal veins, as well as the visualized calf veins. Visualized portions of profunda femoral vein and great saphenous vein unremarkable. No filling defects to suggest DVT on grayscale or color Doppler imaging. Doppler waveforms show normal direction of venous flow, normal respiratory plasticity and response to augmentation. Limited views of the contralateral common femoral vein are unremarkable. OTHER None. Limitations:  none IMPRESSION: Negative. Electronically Signed   By: Vanetta Chou M.D.   On: 06/17/2024 13:17   MR BRAIN WO CONTRAST Result Date: 06/17/2024 CLINICAL DATA:  78 year old female with dizziness, blurred vision for several days. Hypertension. EXAM: MRI HEAD WITHOUT CONTRAST TECHNIQUE: Multiplanar, multiecho pulse sequences of the brain and surrounding structures were obtained without intravenous contrast. COMPARISON:  Head CT 05/04/2023. FINDINGS: Brain: Normal cerebral volume for age. No restricted diffusion to suggest acute infarction. No midline shift, mass effect, evidence of mass lesion, ventriculomegaly, extra-axial collection or acute intracranial hemorrhage. Cervicomedullary junction and pituitary are within normal limits. Largely normal for age gray and white matter signal throughout the brain. No cortical encephalomalacia or chronic cerebral blood products identified. Minimal to mild nonspecific white matter T2 and FLAIR hyperintensity. Vascular: Major intracranial vascular flow voids are preserved. Skull and upper cervical spine: Negative visible cervical spine. Visualized bone marrow signal is within normal limits. Sinuses/Orbits: Normal orbits. Paranasal sinuses and mastoids are stable and well aerated. Other: Grossly normal visible internal auditory structures. Stylomastoid foramina, visible scalp and face appear negative. IMPRESSION: No acute intracranial abnormality. Normal for age noncontrast MRI appearance of the brain. Electronically Signed   By: VEAR Hurst M.D.   On: 06/17/2024 12:23    PROCEDURES and INTERVENTIONS:  Procedures  Medications  lidocaine  (LIDODERM ) 5 % 2 patch (2 patches Transdermal Patch Applied 06/17/24 1138)  acetaminophen  (TYLENOL ) tablet 1,000 mg (1,000 mg Oral Given 06/17/24 1138)  methocarbamol  (ROBAXIN ) tablet 500 mg (500 mg Oral Given 06/17/24 1138)     IMPRESSION / MDM / ASSESSMENT AND PLAN / ED COURSE  I reviewed the triage vital signs and the nursing  notes.  Differential diagnosis includes, but is not limited to, stroke, TIA, PE, DVT, peripheral vertigo or BPPV, anxiety  {Patient presents with symptoms of an acute illness or injury that is potentially life-threatening.  Patient presents to the ED with resolving symptoms of dizziness and blurry vision without evidence of acute pathology and suitable for trial of outpatient management.  No acute neurologic deficits on my exam.  She reports resolving floaters to me initially, this resolves on reassessments, without visual field cuts or persistent blurry vision affecting her visual acuity.  Blood work is benign with a normal CBC and metabolic panel, negative troponin.  MRI brain without evidence of CVA, ICH.,  Ultrasound the left leg due to chronic pain without evidence of DVT.  I recommend observation admission for possible TIA but she declines, with capacity, discussed close return precautions.  Clinical Course as of 06/17/24 1454  Thu Jun 17, 2024  1445 Reassessed, patient reports feeling much better, we discussed workup overall and possible etiologies of her symptoms.  She is very  eager to leave, indicating that she is hungry and wants to go.  We discussed the possibility of TIA, we discussed my recommendation for observation admission but she declines and family is in agreement with taking her home.  We discussed very close return precautions. [DS]    Clinical Course User Index [DS] Claudene Rover, MD     FINAL CLINICAL IMPRESSION(S) / ED DIAGNOSES   Final diagnoses:  Dizziness  Blurry vision     Rx / DC Orders   ED Discharge Orders     None        Note:  This document was prepared using Dragon voice recognition software and may include unintentional dictation errors.   Claudene Rover, MD 06/17/24 1455
